# Patient Record
Sex: Male | Born: 1955 | Race: Black or African American | Hispanic: No | Marital: Single | Smoking: Never smoker
Health system: Southern US, Community
[De-identification: ages and names within clinical notes are randomized; demographics above are authoritative.]

## PROBLEM LIST (undated history)

## (undated) DIAGNOSIS — E785 Hyperlipidemia, unspecified: Secondary | ICD-10-CM

## (undated) DIAGNOSIS — F79 Unspecified intellectual disabilities: Secondary | ICD-10-CM

## (undated) DIAGNOSIS — Z794 Long term (current) use of insulin: Secondary | ICD-10-CM

## (undated) DIAGNOSIS — I10 Essential (primary) hypertension: Secondary | ICD-10-CM

## (undated) DIAGNOSIS — E1169 Type 2 diabetes mellitus with other specified complication: Secondary | ICD-10-CM

## (undated) DIAGNOSIS — E1165 Type 2 diabetes mellitus with hyperglycemia: Secondary | ICD-10-CM

## (undated) DIAGNOSIS — IMO0001 Reserved for inherently not codable concepts without codable children: Secondary | ICD-10-CM

## (undated) HISTORY — DX: Hyperlipidemia, unspecified: E78.5

## (undated) HISTORY — DX: Reserved for inherently not codable concepts without codable children: IMO0001

## (undated) HISTORY — PX: NO PAST SURGERIES: SHX2092

## (undated) HISTORY — DX: Long term (current) use of insulin: Z79.4

## (undated) HISTORY — DX: Type 2 diabetes mellitus with hyperglycemia: E11.65

## (undated) HISTORY — DX: Hyperlipidemia, unspecified: E11.69

## (undated) HISTORY — DX: Essential (primary) hypertension: I10

---

## 1998-02-22 ENCOUNTER — Other Ambulatory Visit: Admission: RE | Admit: 1998-02-22 | Discharge: 1998-02-22 | Payer: Self-pay | Admitting: Internal Medicine

## 1999-04-18 ENCOUNTER — Ambulatory Visit (HOSPITAL_COMMUNITY): Admission: RE | Admit: 1999-04-18 | Discharge: 1999-04-18 | Payer: Self-pay | Admitting: Internal Medicine

## 1999-04-18 ENCOUNTER — Encounter: Payer: Self-pay | Admitting: Internal Medicine

## 2003-10-27 ENCOUNTER — Emergency Department (HOSPITAL_COMMUNITY): Admission: EM | Admit: 2003-10-27 | Discharge: 2003-10-27 | Payer: Self-pay | Admitting: Emergency Medicine

## 2007-10-10 ENCOUNTER — Emergency Department (HOSPITAL_COMMUNITY): Admission: EM | Admit: 2007-10-10 | Discharge: 2007-10-10 | Payer: Self-pay | Admitting: Emergency Medicine

## 2008-10-11 ENCOUNTER — Emergency Department (HOSPITAL_COMMUNITY): Admission: EM | Admit: 2008-10-11 | Discharge: 2008-10-11 | Payer: Self-pay | Admitting: Emergency Medicine

## 2009-11-08 ENCOUNTER — Encounter: Admission: RE | Admit: 2009-11-08 | Discharge: 2009-11-08 | Payer: Self-pay | Admitting: Internal Medicine

## 2011-05-16 LAB — I-STAT 8, (EC8 V) (CONVERTED LAB)
Acid-Base Excess: 1
BUN: 17
Bicarbonate: 27.6 — ABNORMAL HIGH
Chloride: 102
Glucose, Bld: 165 — ABNORMAL HIGH
HCT: 48
Hemoglobin: 16.3
Operator id: 294521
Potassium: 4
Sodium: 135
TCO2: 29
pCO2, Ven: 47.5
pH, Ven: 7.371 — ABNORMAL HIGH

## 2011-05-16 LAB — URINALYSIS, ROUTINE W REFLEX MICROSCOPIC
Bilirubin Urine: NEGATIVE
Glucose, UA: NEGATIVE
Hgb urine dipstick: NEGATIVE
Specific Gravity, Urine: 1.003 — ABNORMAL LOW
pH: 7.5

## 2011-05-16 LAB — POCT I-STAT CREATININE
Creatinine, Ser: 1.1
Operator id: 294521

## 2016-07-06 ENCOUNTER — Emergency Department (HOSPITAL_COMMUNITY): Payer: Medicare Other

## 2016-07-06 ENCOUNTER — Encounter (HOSPITAL_COMMUNITY): Payer: Self-pay

## 2016-07-06 ENCOUNTER — Emergency Department (HOSPITAL_COMMUNITY)
Admission: EM | Admit: 2016-07-06 | Discharge: 2016-07-06 | Disposition: A | Discharge status: Home / Self Care | Payer: Medicare Other | Attending: Emergency Medicine | Admitting: Emergency Medicine

## 2016-07-06 DIAGNOSIS — K2900 Acute gastritis without bleeding: Secondary | ICD-10-CM | POA: Insufficient documentation

## 2016-07-06 DIAGNOSIS — Z7984 Long term (current) use of oral hypoglycemic drugs: Secondary | ICD-10-CM | POA: Diagnosis not present

## 2016-07-06 DIAGNOSIS — E119 Type 2 diabetes mellitus without complications: Secondary | ICD-10-CM | POA: Diagnosis not present

## 2016-07-06 DIAGNOSIS — Z7982 Long term (current) use of aspirin: Secondary | ICD-10-CM | POA: Diagnosis not present

## 2016-07-06 DIAGNOSIS — R1084 Generalized abdominal pain: Secondary | ICD-10-CM

## 2016-07-06 DIAGNOSIS — R1032 Left lower quadrant pain: Secondary | ICD-10-CM | POA: Diagnosis present

## 2016-07-06 DIAGNOSIS — Z79899 Other long term (current) drug therapy: Secondary | ICD-10-CM | POA: Diagnosis not present

## 2016-07-06 DIAGNOSIS — I10 Essential (primary) hypertension: Secondary | ICD-10-CM | POA: Diagnosis not present

## 2016-07-06 HISTORY — DX: Unspecified intellectual disabilities: F79

## 2016-07-06 LAB — CBC
HEMATOCRIT: 43.3 % (ref 39.0–52.0)
Hemoglobin: 13.6 g/dL (ref 13.0–17.0)
MCH: 21.7 pg — AB (ref 26.0–34.0)
MCHC: 31.4 g/dL (ref 30.0–36.0)
MCV: 69.2 fL — AB (ref 78.0–100.0)
Platelets: 273 10*3/uL (ref 150–400)
RBC: 6.26 MIL/uL — ABNORMAL HIGH (ref 4.22–5.81)
RDW: 14.5 % (ref 11.5–15.5)
WBC: 6.6 10*3/uL (ref 4.0–10.5)

## 2016-07-06 LAB — URINALYSIS, ROUTINE W REFLEX MICROSCOPIC
BILIRUBIN URINE: NEGATIVE
Glucose, UA: NEGATIVE mg/dL
HGB URINE DIPSTICK: NEGATIVE
Ketones, ur: NEGATIVE mg/dL
Leukocytes, UA: NEGATIVE
Nitrite: NEGATIVE
Protein, ur: NEGATIVE mg/dL
SPECIFIC GRAVITY, URINE: 1.025 (ref 1.005–1.030)
pH: 7.5 (ref 5.0–8.0)

## 2016-07-06 LAB — COMPREHENSIVE METABOLIC PANEL
ALT: 30 U/L (ref 17–63)
AST: 36 U/L (ref 15–41)
Albumin: 4.1 g/dL (ref 3.5–5.0)
Alkaline Phosphatase: 81 U/L (ref 38–126)
Anion gap: 11 (ref 5–15)
BUN: 19 mg/dL (ref 6–20)
CO2: 25 mmol/L (ref 22–32)
Calcium: 9.5 mg/dL (ref 8.9–10.3)
Chloride: 99 mmol/L — ABNORMAL LOW (ref 101–111)
Creatinine, Ser: 1.3 mg/dL — ABNORMAL HIGH (ref 0.61–1.24)
GFR calc Af Amer: >60 mL/min (ref >60)
GFR calc non Af Amer: 59 mL/min — ABNORMAL LOW (ref >60)
Glucose, Bld: 258 mg/dL — ABNORMAL HIGH (ref 65–99)
Potassium: 3.3 mmol/L — ABNORMAL LOW (ref 3.5–5.1)
Sodium: 135 mmol/L (ref 135–145)
Total Bilirubin: 0.5 mg/dL (ref 0.3–1.2)
Total Protein: 7 g/dL (ref 6.5–8.1)

## 2016-07-06 LAB — I-STAT TROPONIN, ED: Troponin i, poc: 0.01 ng/mL (ref 0.00–0.08)

## 2016-07-06 LAB — LIPASE, BLOOD: Lipase: 26 U/L (ref 11–51)

## 2016-07-06 MED ORDER — HYDROMORPHONE HCL 2 MG/ML IJ SOLN
1.0000 mg | Freq: Once | INTRAMUSCULAR | Status: AC
  Administered 2016-07-06: 1 mg via INTRAVENOUS
  Filled 2016-07-06: qty 1

## 2016-07-06 MED ORDER — IOPAMIDOL (ISOVUE-300) INJECTION 61%
INTRAVENOUS | Status: AC
  Administered 2016-07-06: 100 mL
  Filled 2016-07-06: qty 100

## 2016-07-06 MED ORDER — ONDANSETRON 4 MG PO TBDP: 4.0000 mg | ORAL_TABLET | Freq: Three times a day (TID) | ORAL | 1 refills | Status: DC | PRN

## 2016-07-06 MED ORDER — ONDANSETRON HCL 4 MG/2ML IJ SOLN
4.0000 mg | Freq: Once | INTRAMUSCULAR | Status: AC
  Administered 2016-07-06: 4 mg via INTRAVENOUS
  Filled 2016-07-06: qty 2

## 2016-07-06 MED ORDER — SODIUM CHLORIDE 0.9 % IV SOLN: INTRAVENOUS | Status: DC

## 2016-07-06 MED ORDER — HYDROCODONE-ACETAMINOPHEN 5-325 MG PO TABS: 1.0000 | ORAL_TABLET | Freq: Four times a day (QID) | ORAL | 0 refills | Status: DC | PRN

## 2016-07-06 MED ORDER — SODIUM CHLORIDE 0.9 % IV BOLUS (SEPSIS)
500.0000 mL | Freq: Once | INTRAVENOUS | Status: AC
  Administered 2016-07-06: 500 mL via INTRAVENOUS

## 2016-07-06 NOTE — ED Provider Notes (Signed)
[facility]-EMERGENCY DEPT Provider Note   CSN: [id number] Arrival date & time: 07/06/16  1306     History   Chief Complaint Chief Complaint  Patient presents with  . Abdominal Pain  . Chest Pain    HPI Dalton Williams is a 60 y.o. male.  Patient with complaint of nausea and dry heaves and abdominal pain all over but mostly left lower quadrant. Also some epigastric abdominal pain with radiation into the chest. Patient was seen by his primary care doctor on Thursday did have his flu shot at that time. Also started on MiraLAX for some concerns for constipation. Patient then started with epigastric abdominal pain left lower quadrant abdominal pain dry heaves. Symptoms onset was Friday.      Past Medical History:  Diagnosis Date  . Diabetes mellitus without complication (HCC)   . Hypertension   . Mental retardation     There are no active problems to display for this patient.   No past surgical history on file.     Home Medications    Prior to Admission medications   Medication Sig Start Date End Date Taking? Authorizing Provider  amLODipine (NORVASC) 5 MG tablet Take 5 mg by mouth daily. 06/03/16  Yes Historical Provider, MD  aspirin 81 MG chewable tablet Chew 81 mg by mouth daily.   Yes Historical Provider, MD  hydrochlorothiazide (HYDRODIURIL) 25 MG tablet Take 25 mg by mouth daily. 05/30/16  Yes Historical Provider, MD  irbesartan (AVAPRO) 300 MG tablet Take 300 mg by mouth daily. 06/03/16  Yes Historical Provider, MD  metFORMIN (GLUCOPHAGE) 1000 MG tablet Take 1,000 mg by mouth 2 (two) times daily. 05/11/16  Yes Historical Provider, MD  Multiple Vitamin (MULTIVITAMIN) tablet Take 1 tablet by mouth daily.   Yes Historical Provider, MD  oxybutynin (DITROPAN) 5 MG tablet Take 5 mg by mouth 2 (two) times daily. 04/24/16  Yes Historical Provider, MD  HYDROcodone-acetaminophen (NORCO/VICODIN) 5-325 MG tablet Take 1-2 tablets by mouth every 6 (six) hours as needed for moderate  pain. 07/06/16   Vanetta Mulders, MD  ondansetron (ZOFRAN ODT) 4 MG disintegrating tablet Take 1 tablet (4 mg total) by mouth every 8 (eight) hours as needed for nausea or vomiting. 07/06/16   Vanetta Mulders, MD    Family History No family history on file.  Social History Social History  Substance Use Topics  . Smoking status: Never Smoker  . Smokeless tobacco: Never Used  . Alcohol use No     Allergies   Penicillins   Review of Systems Review of Systems  Constitutional: Negative for fever.  HENT: Negative for congestion.   Eyes: Negative for redness.  Respiratory: Negative for shortness of breath.   Cardiovascular: Positive for chest pain. Negative for leg swelling.  Gastrointestinal: Positive for abdominal pain, nausea and vomiting. Negative for diarrhea.  Genitourinary: Negative for dysuria.  Musculoskeletal: Negative for back pain and neck pain.  Skin: Negative for rash.  Neurological: Negative for headaches.  Hematological: Does not bruise/bleed easily.  Psychiatric/Behavioral: Negative for confusion.     Physical Exam Updated Vital Signs BP 124/74   Pulse 83   Temp 99.1 F (37.3 C) (Oral)   Resp 13   Ht 5\' 2"  (1.575 m)   Wt 93 kg   SpO2 96%   BMI 37.49 kg/m   Physical Exam  Constitutional: He is oriented to person, place, and time. He appears well-developed and well-nourished. No distress.  HENT:  Head: Normocephalic and atraumatic.  Mouth/Throat: Oropharynx is clear  and moist.  Eyes: EOM are normal. Pupils are equal, round, and reactive to light.  Neck: Normal range of motion. Neck supple.  Cardiovascular: Normal rate, regular rhythm and normal heart sounds.   Pulmonary/Chest: Effort normal and breath sounds normal. No respiratory distress.  Abdominal: Soft. Bowel sounds are normal. There is tenderness.  Mild tenderness left lower quadrant. No guarding.  Musculoskeletal: He exhibits no edema.  Neurological: He is alert and oriented to person,  place, and time. No cranial nerve deficit or sensory deficit. He exhibits normal muscle tone. Coordination normal.  Skin: Skin is warm.  Nursing note and vitals reviewed.    ED Treatments / Results  Labs (all labs ordered are listed, but only abnormal results are displayed) Labs Reviewed  CBC - Abnormal; Notable for the following:       Result Value   RBC 6.26 (*)    MCV 69.2 (*)    MCH 21.7 (*)    All other components within normal limits  COMPREHENSIVE METABOLIC PANEL - Abnormal; Notable for the following:    Potassium 3.3 (*)    Chloride 99 (*)    Glucose, Bld 258 (*)    Creatinine, Ser 1.30 (*)    GFR calc non Af Amer 59 (*)    All other components within normal limits  LIPASE, BLOOD  URINALYSIS, ROUTINE W REFLEX MICROSCOPIC (NOT [facility])  I-STAT TROPOININ, ED    EKG  EKG Interpretation  Date/Time:  Sunday July 06 2016 13:12:41 EST Ventricular Rate:  106 PR Interval:    QRS Duration: 96 QT Interval:  336 QTC Calculation: 447 R Axis:   -30 Text Interpretation:  Sinus tachycardia Multiple premature complexes, vent & supraven Abnormal R-wave progression, early transition LVH with secondary repolarization abnormality No significant change since last tracing Confirmed by Theadora Noyes  MD, [facility] [phone]([id number]) on 07/06/2016 2:20:17 PM       Radiology Ct Abdomen Pelvis W Contrast  Result Date: 07/06/2016 CLINICAL DATA:  Lower abdominal pain with nausea. EXAM: CT ABDOMEN AND PELVIS WITH CONTRAST TECHNIQUE: Multidetector CT imaging of the abdomen and pelvis was performed using the standard protocol following bolus administration of intravenous contrast. CONTRAST:  100mL ISOVUE-300 IOPAMIDOL (ISOVUE-300) INJECTION 61% COMPARISON:  November 08, 2009 FINDINGS: Lower chest: No acute abnormality. Hepatobiliary: No focal liver abnormality is seen. No gallstones, gallbladder wall thickening, or biliary dilatation. Pancreas: Unremarkable. No pancreatic ductal dilatation or surrounding  inflammatory changes. Spleen: Normal in size without focal abnormality. Adrenals/Urinary Tract: The adrenal glands are within normal limits. Bilateral renal cysts are identified. The kidneys are otherwise normal. Stomach/Bowel: The stomach and small bowel are normal. The colon and appendix are unremarkable. Vascular/Lymphatic: The abdominal aorta is non aneurysmal with mild atherosclerotic change. No adenopathy. Reproductive: Prostate is unremarkable. Other: No other abnormalities. Musculoskeletal: Mild retrolisthesis of L5 versus S1 is stable. Degenerative changes are seen most marked at L5-S1. IMPRESSION: 1. No cause for acute symptoms identified. 2. Mild atherosclerotic change in the abdominal aorta. Electronically Signed   By: Gerome Samavid  Williams III M.D   On: 07/06/2016 16:00   Dg Chest Portable 1 View  Result Date: 07/06/2016 CLINICAL DATA:  Epigastric pain.  Chest pain and diaphoresis. EXAM: PORTABLE CHEST 1 VIEW COMPARISON:  10/11/2008 FINDINGS: Cardiac enlargement. Mediastinal contours are within normal limits. Both lungs are clear. The visualized skeletal structures are unremarkable. IMPRESSION: 1. Cardiac enlargement. 2. No acute findings. Electronically Signed   By: Signa Kellaylor  Stroud M.D.   On: 07/06/2016 13:39    Procedures  Procedures (including critical care time)  Medications Ordered in ED Medications  0.9 %  sodium chloride infusion ( Intravenous Hold 07/06/16 1414)  sodium chloride 0.9 % bolus 500 mL (0 mLs Intravenous Stopped 07/06/16 1445)  ondansetron (ZOFRAN) injection 4 mg (4 mg Intravenous Given 07/06/16 1414)  HYDROmorphone (DILAUDID) injection 1 mg (1 mg Intravenous Given 07/06/16 1416)  iopamidol (ISOVUE-300) 61 % injection (100 mLs  Contrast Given 07/06/16 1534)     Initial Impression / Assessment and Plan / ED Course  I have reviewed the triage vital signs and the nursing notes.  Pertinent labs & imaging results that were available during my care of the patient were  reviewed by me and considered in my medical decision making (see chart for details).  Clinical Course     Patient nontoxic no acute distress. Patient had normal physical examination done by his primary care doctor on Thursday. There was some concern perhaps for constipation at that time so he was started on MiraLAX. Friday patient with onset of nausea and dry heaves and abdominal pain. Pain predominantly left lower quadrant but hurts all over.  Workup here without any significant lab abnormalities no sniffing vital sign abnormalities and CT scan the abdomen and pelvis was negative for any acute findings. In addition patient troponin was normal. Did have some burning in the epigastric area into the chest. And patient's EKG had no acute changes. Chest x-ray was negative.  Suspect a viral illness most likely a viral gastritis. We'll treat symptomatically and close follow-up with primary care doctor.  Final Clinical Impressions(s) / ED Diagnoses   Final diagnoses:  Generalized abdominal pain  Acute gastritis without hemorrhage, unspecified gastritis type    New Prescriptions New Prescriptions   HYDROCODONE-ACETAMINOPHEN (NORCO/VICODIN) 5-325 MG TABLET    Take 1-2 tablets by mouth every 6 (six) hours as needed for moderate pain.   ONDANSETRON (ZOFRAN ODT) 4 MG DISINTEGRATING TABLET    Take 1 tablet (4 mg total) by mouth every 8 (eight) hours as needed for nausea or vomiting.     Vanetta MuldersScott Jamorris Ndiaye, MD 07/06/16 [phone]

## 2016-07-06 NOTE — ED Notes (Signed)
Pt transported to CT ?

## 2016-07-06 NOTE — Discharge Instructions (Signed)
Return for any new or worse symptoms. Today's workup including CT of the abdomen without any acute findings. Suspect a viral stomach illness. Take Zofran as needed for the nausea and dry heaves. Take the hydrocodone as needed for abdominal pain. Follow-up with your doctor in the next few days if not better.

## 2016-07-06 NOTE — ED Triage Notes (Addendum)
Per EMS - pt from home. Pt c/o epigastric pain, dry heaves, diaphoresis x few days. Pt hx MR, diabetes, htn, abd issues. Given 4mg  zofran.

## 2017-02-24 DIAGNOSIS — F819 Developmental disorder of scholastic skills, unspecified: Secondary | ICD-10-CM | POA: Insufficient documentation

## 2017-04-09 DIAGNOSIS — E1169 Type 2 diabetes mellitus with other specified complication: Secondary | ICD-10-CM | POA: Insufficient documentation

## 2018-06-22 IMAGING — CT CT ABD-PELV W/ CM
2 of 5 series · 11 of 46 positions shown, 12 images · IV contrast (Iodine)
Comparison: November 08, 2009

CLINICAL DATA: Lower abdominal pain with nausea.

EXAM:
CT ABDOMEN AND PELVIS WITH CONTRAST
TECHNIQUE: Multidetector CT imaging of the abdomen and pelvis was performed
using the standard protocol following bolus administration of
intravenous contrast.
CONTRAST:  100mL 1VCE4B-UEE IOPAMIDOL (1VCE4B-UEE) INJECTION 61%

[Series 201: routine, idose (2) · axial · 0.86mm/px · z∈[+120,+500]mm · 8 of 93 slices shown, 9 images]
[im 11/93  soft-tissue]
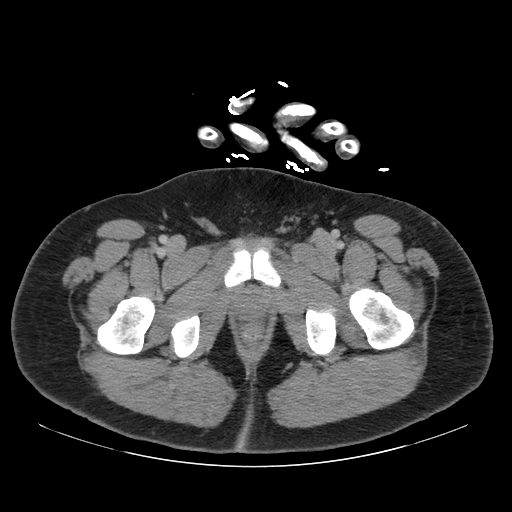
[im 11/93  bone]
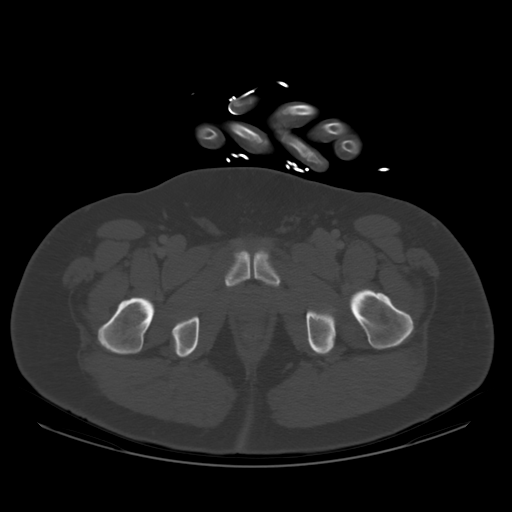
[im 22/93  soft-tissue]
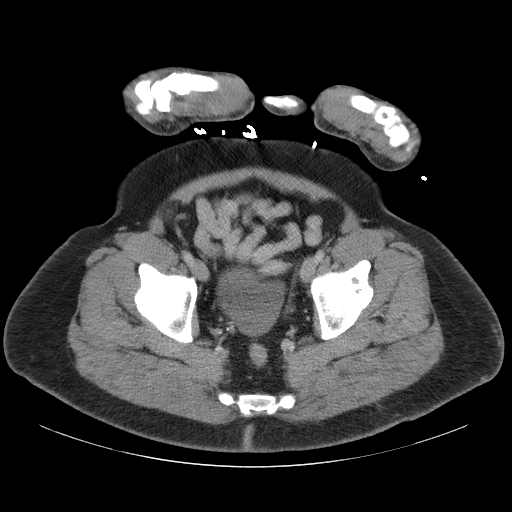
[im 33/93  soft-tissue]
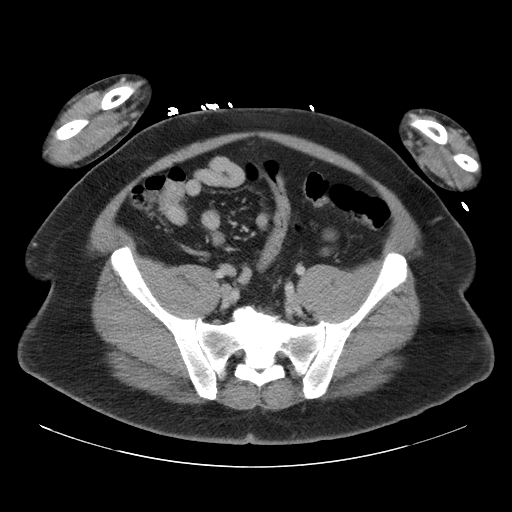
[im 44/93  soft-tissue]
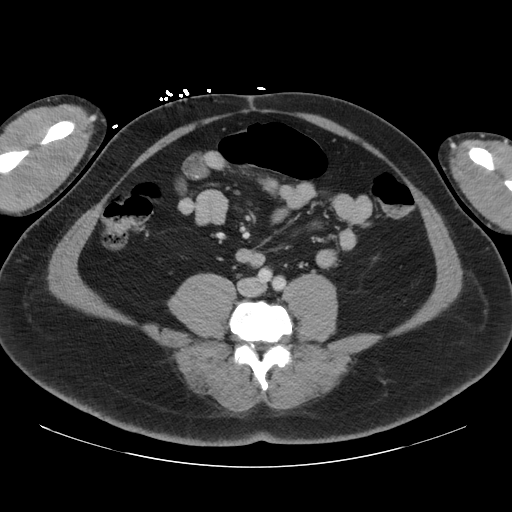
[im 55/93  soft-tissue]
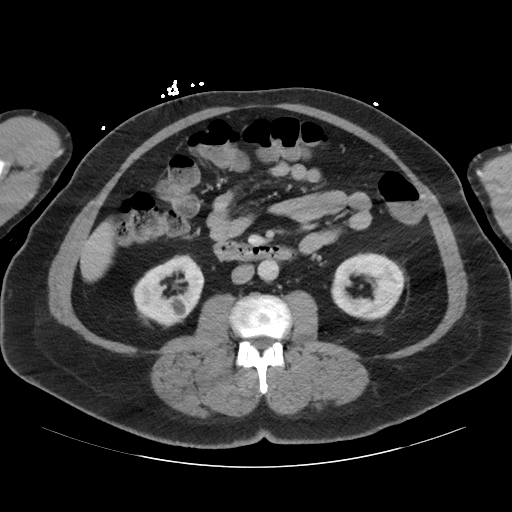
[im 65/93  soft-tissue]
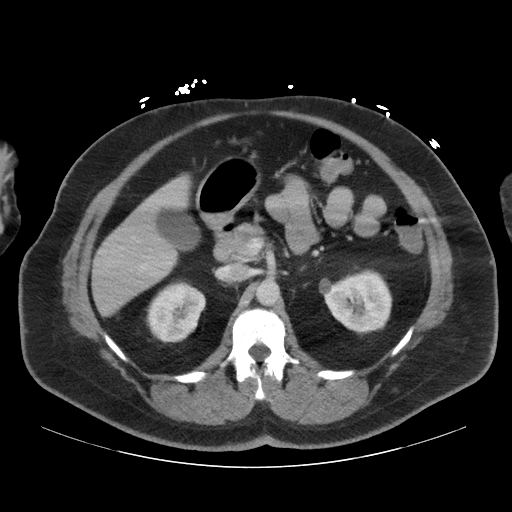
[im 76/93  soft-tissue]
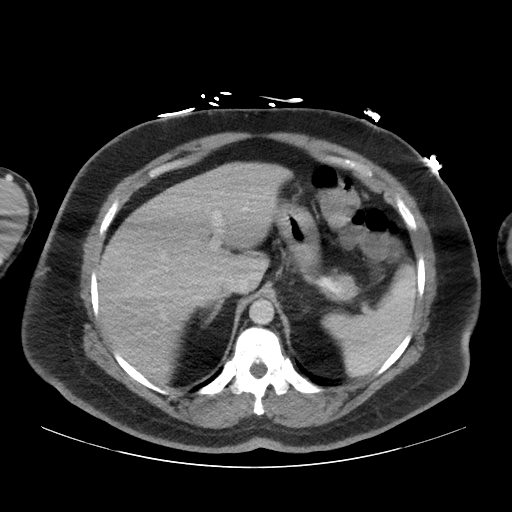
[im 87/93  soft-tissue]
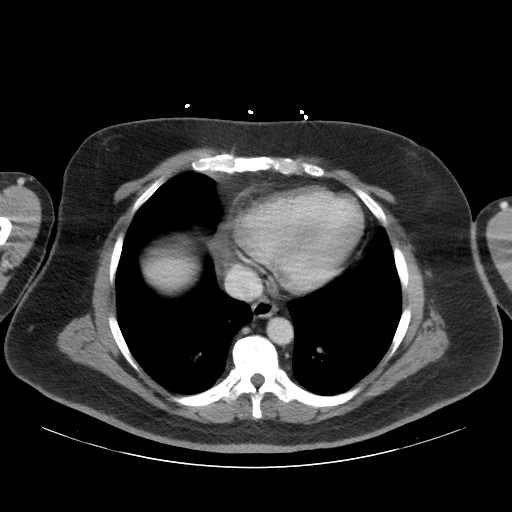

[Series 203: coronals, idose (2) · coronal · 0.45mm/px · 3 of 142 slices shown]
[im 48/142  soft-tissue]
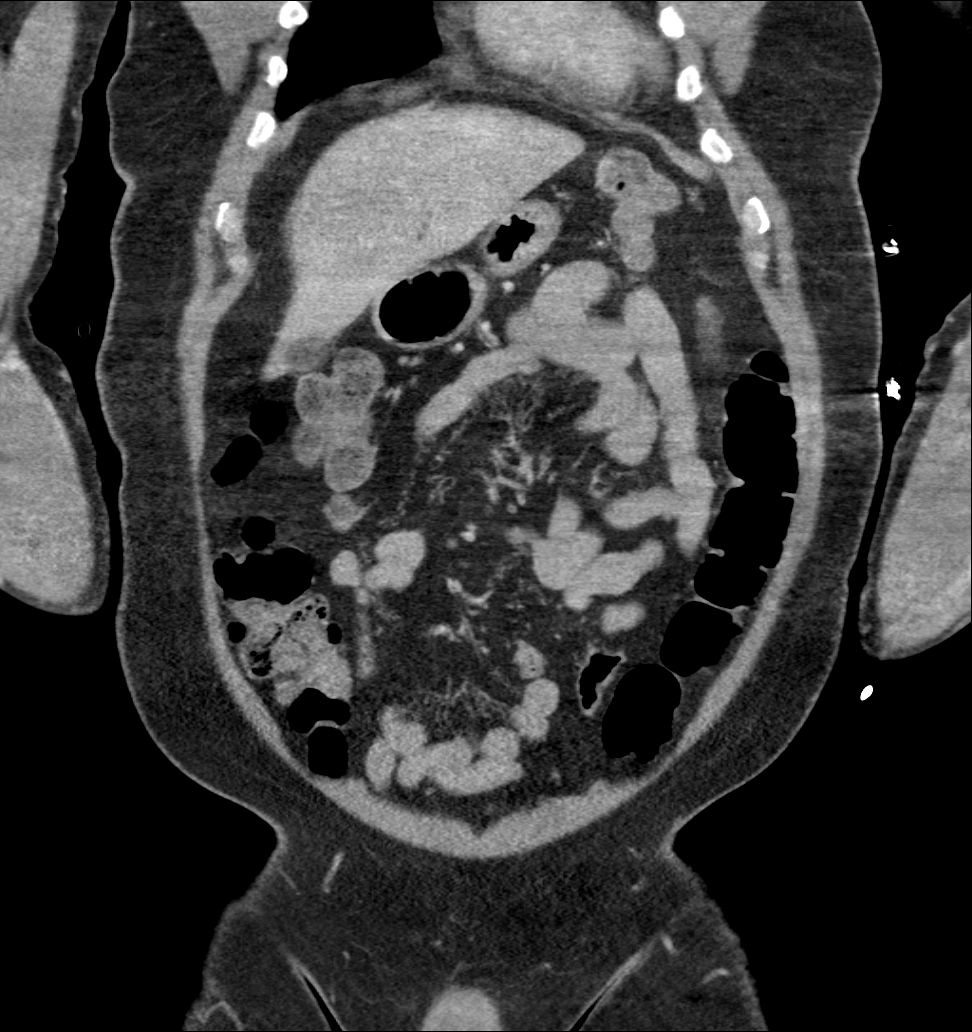
[im 63/142  soft-tissue]
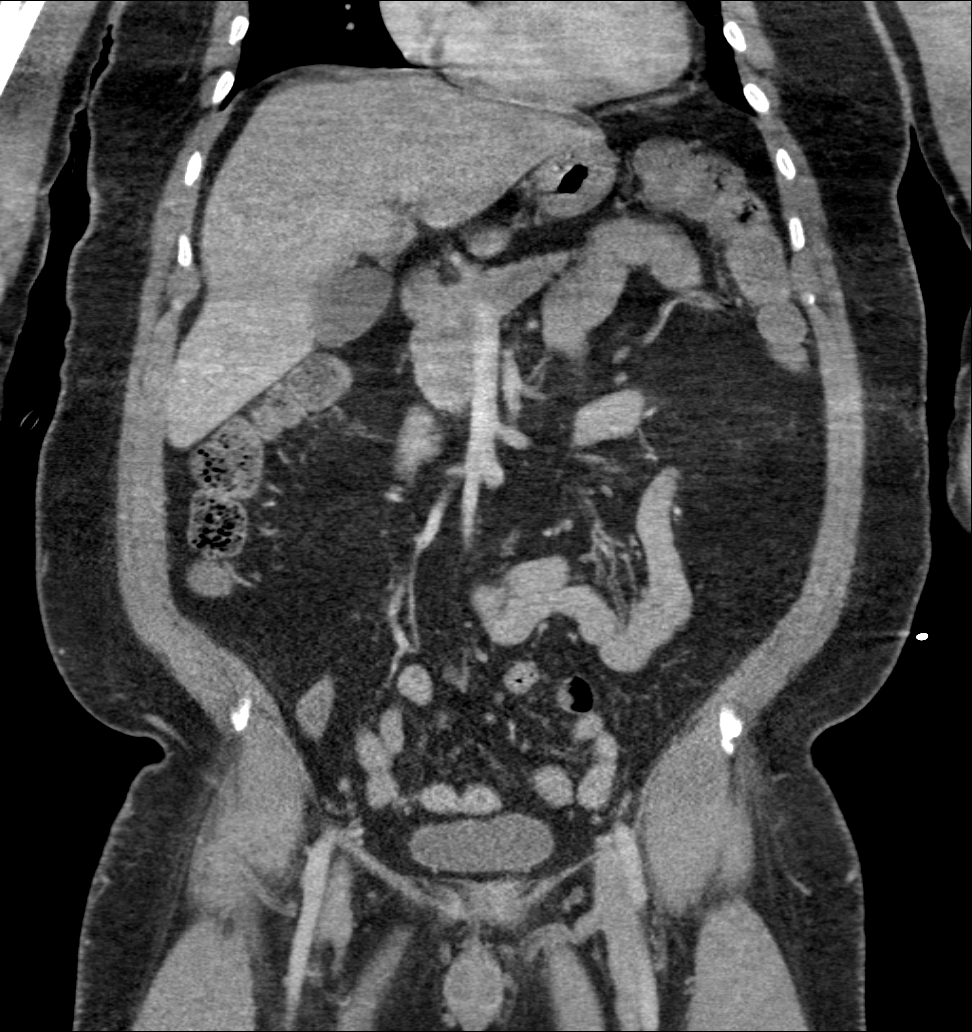
[im 79/142  soft-tissue]
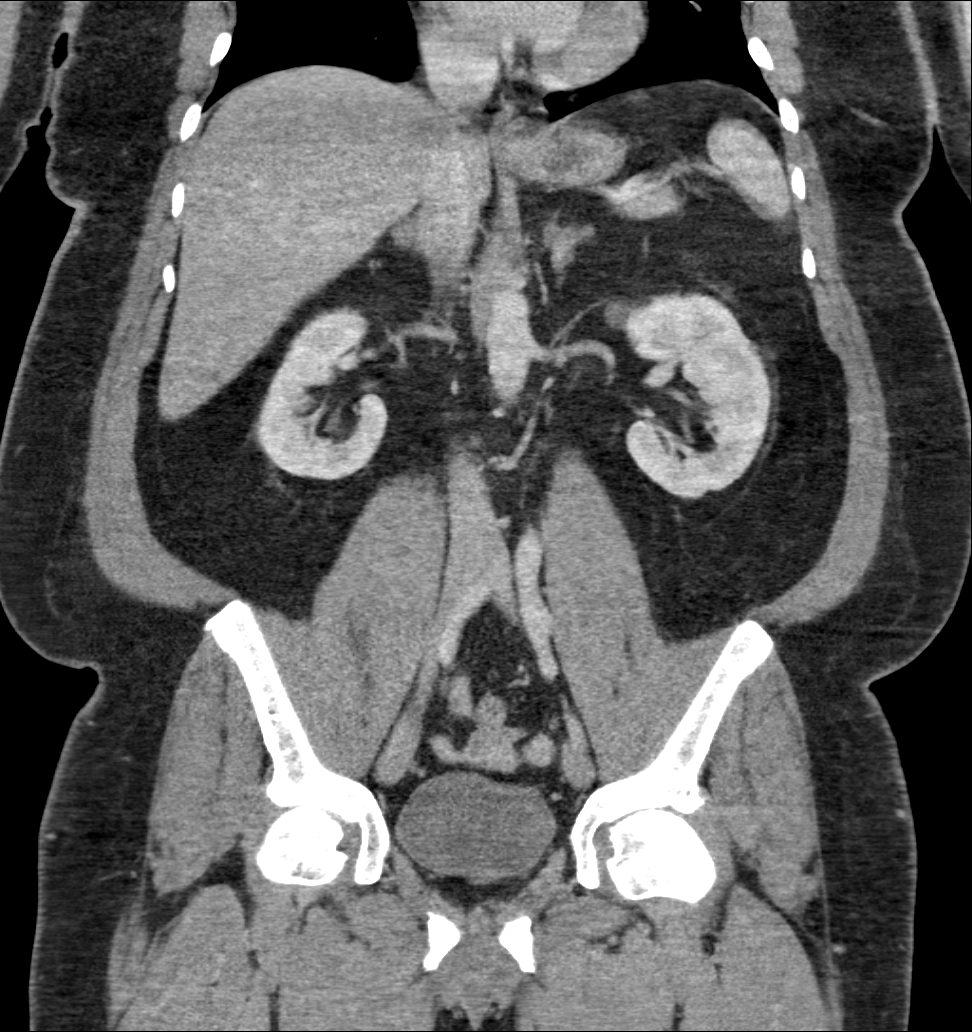

[11 of 46 positions shown; findings below may reference images not displayed]

FINDINGS: Lower chest: No acute abnormality.

Hepatobiliary: No focal liver abnormality is seen. No gallstones,
gallbladder wall thickening, or biliary dilatation.

Pancreas: Unremarkable. No pancreatic ductal dilatation or
surrounding inflammatory changes.

Spleen: Normal in size without focal abnormality.

Adrenals/Urinary Tract: The adrenal glands are within normal limits.
Bilateral renal cysts are identified. The kidneys are otherwise
normal.

Stomach/Bowel: The stomach and small bowel are normal. The colon and
appendix are unremarkable.

Vascular/Lymphatic: The abdominal aorta is non aneurysmal with mild
atherosclerotic change. No adenopathy.

Reproductive: Prostate is unremarkable.

Other: No other abnormalities.

Musculoskeletal: Mild retrolisthesis of L5 versus S1 is stable.
Degenerative changes are seen most marked at L5-S1.
IMPRESSION: 1. No cause for acute symptoms identified.
2. Mild atherosclerotic change in the abdominal aorta.

## 2018-09-30 ENCOUNTER — Ambulatory Visit (INDEPENDENT_AMBULATORY_CARE_PROVIDER_SITE_OTHER): Payer: Medicare Other | Admitting: Family Medicine

## 2018-09-30 ENCOUNTER — Encounter: Payer: Self-pay | Admitting: Family Medicine

## 2018-09-30 VITALS — BP 183/92 | HR 92 | Resp 17 | Ht 63.0 in | Wt 213.8 lb

## 2018-09-30 DIAGNOSIS — R0602 Shortness of breath: Secondary | ICD-10-CM

## 2018-09-30 DIAGNOSIS — I10 Essential (primary) hypertension: Secondary | ICD-10-CM

## 2018-09-30 DIAGNOSIS — E1165 Type 2 diabetes mellitus with hyperglycemia: Secondary | ICD-10-CM

## 2018-09-30 DIAGNOSIS — Z794 Long term (current) use of insulin: Secondary | ICD-10-CM

## 2018-09-30 DIAGNOSIS — I517 Cardiomegaly: Secondary | ICD-10-CM | POA: Diagnosis not present

## 2018-09-30 DIAGNOSIS — E1169 Type 2 diabetes mellitus with other specified complication: Secondary | ICD-10-CM

## 2018-09-30 DIAGNOSIS — Z7689 Persons encountering health services in other specified circumstances: Secondary | ICD-10-CM

## 2018-09-30 LAB — GLUCOSE, POCT (MANUAL RESULT ENTRY): POC GLUCOSE: 159 mg/dL — AB (ref 70–99)

## 2018-09-30 MED ORDER — CARVEDILOL 6.25 MG PO TABS: 6.2500 mg | ORAL_TABLET | Freq: Two times a day (BID) | ORAL | 3 refills | Status: DC

## 2018-09-30 NOTE — Progress Notes (Signed)
Dalton Williams, is a 63 y.o. male  ZOX:[id number]:[id number]  [id number]:[id number]  DOB - [birth year]  CC:  Chief Complaint  Patient presents with  . Establish Care  . Diabetes  . Hypertension  . Hyperlipidemia       HPI: Dalton Williams is a 63 y.o. male is here today to establish care.   Dalton Williams has Diabetes mellitus (HCC); Left ventricular hypertrophy; SOB (shortness of breath); and Essential hypertension on their problem list.  Patient presents with his mother. Patient has a cognitive impairment and lives with mother. He is verbal and well aware of his health conditions however, requires assistant with medications. Mother and patient are providing the history.  Diabetes  Monitors blood sugar at home.  Reports fasting readings range anywhere from 118-130 fasting. Blood glucose on arrival is 159 non-fasting. Reports insulin dependent diabetic since adolescents.  Last A1C 7.8 in December. Mom endorses efforts to provide healthy low carbohydrate meals. Patient denies hypoglycemia.  Adheres to current medication regimen. Reports associated foot pain, which persists in spite of regularly taking Gabapentin. Denies any associated urinary frequency, visual disturbances, episodic diaphoresis or hot flashes.   Hypertension  Dalton Williams reports no home monitoring of blood pressure. Reports adherence to blood pressure medications. Reports efforts to adhere to low sodium diet. He is a nonsmoker. Denies any episodes of dizziness,headaches, or chest pain. Mother reports a history of LVH. patietn endorses chronic shortness of breath with exertion and while at rest. Denies cough or edema. No formal follow-up by cardiology recently but mother reports patient was to see a cardiologist at some point. No known history of echocardiogram. LVH is noted in review of EMR.   Current medications: Current Outpatient Medications:  .  amLODipine (NORVASC) 10 MG tablet, Take 1 tablet by mouth daily., Disp: , Rfl:  .  aspirin  81 MG chewable tablet, Chew 81 mg by mouth daily., Disp: , Rfl:  .  insulin NPH Human (HUMULIN N,NOVOLIN N) 100 UNIT/ML injection, 40 units qam, 35 units qpm, Disp: , Rfl:  .  Insulin Syringe-Needle U-100 (BD INSULIN SYRINGE U/F) 31G X 5/16" 0.5 ML MISC, USE AS DIRECTED FOR INSULIN INJECTIONS TWICE DAILY, Disp: , Rfl:  .  irbesartan (AVAPRO) 300 MG tablet, Take 300 mg by mouth daily., Disp: , Rfl: 6 .  metFORMIN (GLUCOPHAGE) 500 MG tablet, TAKE 2 TABLETS(1000 MG) BY MOUTH TWICE DAILY WITH MEALS, Disp: , Rfl:  .  Multiple Vitamin (MULTIVITAMIN) tablet, Take 1 tablet by mouth daily., Disp: , Rfl:  .  oxybutynin (DITROPAN) 5 MG tablet, Take 5 mg by mouth 2 (two) times daily., Disp: , Rfl: 3 .  pravastatin (PRAVACHOL) 80 MG tablet, Take 1 tablet by mouth daily., Disp: , Rfl:  .  hydrochlorothiazide (HYDRODIURIL) 25 MG tablet, Take 25 mg by mouth daily., Disp: , Rfl: 6   Pertinent family medical history: family history includes Diabetes in his mother and sister; Kidney failure in his father; Stroke in his brother.   Allergies  Allergen Reactions  . Penicillins Rash    Social History   Socioeconomic History  . Marital status: Single    Spouse name: Not on file  . Number of children: Not on file  . Years of education: Not on file  . Highest education level: Not on file  Occupational History  . Not on file  Social Needs  . Financial resource strain: Not on file  . Food insecurity:    Worry: Not on file    Inability: Not on file  .  Transportation needs:    Medical: Not on file    Non-medical: Not on file  Tobacco Use  . Smoking status: Never Smoker  . Smokeless tobacco: Never Used  Substance and Sexual Activity  . Alcohol use: No  . Drug use: No  . Sexual activity: Not on file  Lifestyle  . Physical activity:    Days per week: Not on file    Minutes per session: Not on file  . Stress: Not on file  Relationships  . Social connections:    Talks on phone: Not on file    Gets  together: Not on file    Attends religious service: Not on file    Active member of club or organization: Not on file    Attends meetings of clubs or organizations: Not on file    Relationship status: Not on file  . Intimate partner violence:    Fear of current or ex partner: Not on file    Emotionally abused: Not on file    Physically abused: Not on file    Forced sexual activity: Not on file  Other Topics Concern  . Not on file  Social History Narrative  . Not on file    Review of Systems: Constitutional: Negative for fever, chills, diaphoresis, activity change, appetite change and fatigue. HENT: Negative for ear pain, nosebleeds, congestion, facial swelling, rhinorrhea, neck pain, neck stiffness and ear discharge.  Eyes: Negative for pain, discharge, redness, itching and visual disturbance. Respiratory: Negative for cough, choking, chest tightness, shortness of breath, wheezing and stridor.  Cardiovascular: Negative for chest pain, palpitations and leg swelling. Gastrointestinal: Negative for abdominal distention. Genitourinary: Negative for dysuria, urgency, frequency, hematuria, flank pain, decreased urine volume, difficulty urinating. Musculoskeletal: Negative for back pain, joint swelling, arthralgia and gait problem. Neurological: Negative for dizziness, tremors, seizures, syncope, facial asymmetry, speech difficulty, weakness, light-headedness, numbness and headaches.  Hematological: Negative for adenopathy. Does not bruise/bleed easily. Psychiatric/Behavioral: Negative for hallucinations, behavioral problems, confusion, dysphoric mood, decreased concentration and agitation.    Objective:   Vitals:   09/30/18 1003  BP: (!) 183/92  Pulse: 92  Resp: 17  SpO2: 95%    BP Readings from Last 3 Encounters:  09/30/18 (!) 183/92  07/06/16 124/74    Filed Weights   09/30/18 1003  Weight: 213 lb 12.8 oz (97 kg)      Physical Exam: Constitutional: Patient appears  well-developed and well-nourished. No distress. HENT: Normocephalic, atraumatic, External right and left ear normal.  Eyes: Conjunctivae and EOM are normal. PERRLA, no scleral icterus. Neck: Normal ROM. Neck supple. No JVD. No tracheal deviation. No thyromegaly. CVS: RRR, S1/S2 +, no murmurs, no gallops, no carotid bruit.  Pulmonary: Effort and breath sounds normal, no stridor, rhonchi, wheezes, rales.  Abdominal: Soft. BS +, no distension, tenderness, rebound or guarding.  Musculoskeletal: Normal range of motion. No edema and no tenderness.  Neuro: Alert. Normal muscle tone coordination. Normal gait. BUE and BLE strength 5/5. Bilateral hand grips symmetrical. Skin: Skin is warm and dry. No rash noted. Not diaphoretic. No erythema. No pallor. Psychiatric: Normal mood and affect. Behavior, judgment, thought content normal.  Lab Results (prior encounters)  Lab Results  Component Value Date   WBC 6.6 07/06/2016   HGB 13.6 07/06/2016   HCT 43.3 07/06/2016   MCV 69.2 (L) 07/06/2016   PLT 273 07/06/2016   Lab Results  Component Value Date   CREATININE 1.30 (H) 07/06/2016   BUN 19 07/06/2016   NA 135 07/06/2016  K 3.3 (L) 07/06/2016   CL 99 (L) 07/06/2016   CO2 25 07/06/2016       Assessment and plan:  1. Encounter to establish care  2. Type 2 diabetes mellitus with other specified complication, with long-term current use of insulin (HCC) Most recent A1c 7.8 will repeat at subsequent follow-up.  No changes to current medication regimen as blood sugars have remained less than 150 at home. - Glucose (CBG)-159  3. Essential hypertension We have discussed target BP range and blood pressure goal. I have advised patient to check BP regularly and to call us back or report to clinic if the numbers are consistently higher than 140/90. We discussed the importance of compliance with medical therapy and DASH diet recommended, consequences of uncontrolled hypertension discussed.   4. Left  ventricular hypertrophy Referring patient to cardiology for further evaluation of shortness of breath.  - Ambulatory referral to Cardiology  5. SOB (shortness of breath), - Ambulatory referral to Cardiology   Return in about 6 weeks (around 11/11/2018) for Hypertension BP evaluation .   The patient was given clear instructions to go to ER or return to medical center if symptoms don't improve, worsen or new problems develop. The patient verbalized understanding. The patient was advised  to call and obtain lab results if they haven't heard anything from out office within 7-10 business days.  Joaquin Courts, FNP Primary [facility] at [facility] [address], [place] [address] [phone]: [phone]    This note has been created with Dragon speech recognition software and Paediatric nurse. Any transcriptional errors are unintentional.

## 2018-09-30 NOTE — Patient Instructions (Addendum)
[facility]    Edward DenmarkEngland was seen by Joaquin CourtsKimberly Harris, FNP today.   Caylan Pollan's primary care provider is Bing NeighborsHarris, Kimberly S, FNP.   For the best care possible, you should try to see Joaquin CourtsKimberly Harris, FNP-C whenever you come to the clinic.   We look forward to seeing you again soon!  If you have any questions about your visit today, please call us at [phone] or feel free to reach your primary care provider via MyChart.         Hypertension Hypertension, commonly called high blood pressure, is when the force of blood pumping through the arteries is too strong. The arteries are the blood vessels that carry blood from the heart throughout the body. Hypertension forces the heart to work harder to pump blood and may cause arteries to become narrow or stiff. Having untreated or uncontrolled hypertension can cause heart attacks, strokes, kidney disease, and other problems. A blood pressure reading consists of a higher number over a lower number. Ideally, your blood pressure should be below 120/80. The first ("top") number is called the systolic pressure. It is a measure of the pressure in your arteries as your heart beats. The second ("bottom") number is called the diastolic pressure. It is a measure of the pressure in your arteries as the heart relaxes. What are the causes? The cause of this condition is not known. What increases the risk? Some risk factors for high blood pressure are under your control. Others are not. Factors you can change  Smoking.  Having type 2 diabetes mellitus, high cholesterol, or both.  Not getting enough exercise or physical activity.  Being overweight.  Having too much fat, sugar, calories, or salt (sodium) in your diet.  Drinking too much alcohol. Factors that are difficult or impossible to change  Having chronic kidney disease.  Having a family history of high blood  pressure.  Age. Risk increases with age.  Race. You may be at higher risk if you are African-American.  Gender. Men are at higher risk than women before age 62. After age 62, women are at higher risk than men.  Having obstructive sleep apnea.  Stress. What are the signs or symptoms? Extremely high blood pressure (hypertensive crisis) may cause:  Headache.  Anxiety.  Shortness of breath.  Nosebleed.  Nausea and vomiting.  Severe chest pain.  Jerky movements you cannot control (seizures). How is this diagnosed? This condition is diagnosed by measuring your blood pressure while you are seated, with your arm resting on a surface. The cuff of the blood pressure monitor will be placed directly against the skin of your upper arm at the level of your heart. It should be measured at least twice using the same arm. Certain conditions can cause a difference in blood pressure between your right and left arms. Certain factors can cause blood pressure readings to be lower or higher than normal (elevated) for a short period of time:  When your blood pressure is higher when you are in a health care provider's office than when you are at home, this is called white coat hypertension. Most people with this condition do not need medicines.  When your blood pressure is higher at home than when you are in a health care provider's office, this is called masked hypertension. Most people with this condition may need medicines to control blood pressure. If you have a high blood pressure reading during  one visit or you have normal blood pressure with other risk factors:  You may be asked to return on a different day to have your blood pressure checked again.  You may be asked to monitor your blood pressure at home for 1 week or longer. If you are diagnosed with hypertension, you may have other blood or imaging tests to help your health care provider understand your overall risk for other conditions. How  is this treated? This condition is treated by making healthy lifestyle changes, such as eating healthy foods, exercising more, and reducing your alcohol intake. Your health care provider may prescribe medicine if lifestyle changes are not enough to get your blood pressure under control, and if:  Your systolic blood pressure is above 130.  Your diastolic blood pressure is above 80. Your personal target blood pressure may vary depending on your medical conditions, your age, and other factors. Follow these instructions at home: Eating and drinking   Eat a diet that is high in fiber and potassium, and low in sodium, added sugar, and fat. An example eating plan is called the DASH (Dietary Approaches to Stop Hypertension) diet. To eat this way: ? Eat plenty of fresh fruits and vegetables. Try to fill half of your plate at each meal with fruits and vegetables. ? Eat whole grains, such as whole wheat pasta, brown rice, or whole grain bread. Fill about one quarter of your plate with whole grains. ? Eat or drink low-fat dairy products, such as skim milk or low-fat yogurt. ? Avoid fatty cuts of meat, processed or cured meats, and poultry with skin. Fill about one quarter of your plate with lean proteins, such as fish, chicken without skin, beans, eggs, and tofu. ? Avoid premade and processed foods. These tend to be higher in sodium, added sugar, and fat.  Reduce your daily sodium intake. Most people with hypertension should eat less than 1,500 mg of sodium a day.  Limit alcohol intake to no more than 1 drink a day for nonpregnant women and 2 drinks a day for men. One drink equals 12 oz of beer, 5 oz of wine, or 1 oz of hard liquor. Lifestyle   Work with your health care provider to maintain a healthy body weight or to lose weight. Ask what an ideal weight is for you.  Get at least 30 minutes of exercise that causes your heart to beat faster (aerobic exercise) most days of the week. Activities may  include walking, swimming, or biking.  Include exercise to strengthen your muscles (resistance exercise), such as pilates or lifting weights, as part of your weekly exercise routine. Try to do these types of exercises for 30 minutes at least 3 days a week.  Do not use any products that contain nicotine or tobacco, such as cigarettes and e-cigarettes. If you need help quitting, ask your health care provider.  Monitor your blood pressure at home as told by your health care provider.  Keep all follow-up visits as told by your health care provider. This is important. Medicines  Take over-the-counter and prescription medicines only as told by your health care provider. Follow directions carefully. Blood pressure medicines must be taken as prescribed.  Do not skip doses of blood pressure medicine. Doing this puts you at risk for problems and can make the medicine less effective.  Ask your health care provider about side effects or reactions to medicines that you should watch for. Contact a health care provider if:  You think you  are having a reaction to a medicine you are taking.  You have headaches that keep coming back (recurring).  You feel dizzy.  You have swelling in your ankles.  You have trouble with your vision. Get help right away if:  You develop a severe headache or confusion.  You have unusual weakness or numbness.  You feel faint.  You have severe pain in your chest or abdomen.  You vomit repeatedly.  You have trouble breathing. Summary  Hypertension is when the force of blood pumping through your arteries is too strong. If this condition is not controlled, it may put you at risk for serious complications.  Your personal target blood pressure may vary depending on your medical conditions, your age, and other factors. For most people, a normal blood pressure is less than 120/80.  Hypertension is treated with lifestyle changes, medicines, or a combination of both.  Lifestyle changes include weight loss, eating a healthy, low-sodium diet, exercising more, and limiting alcohol. This information is not intended to replace advice given to you by your health care provider. Make sure you discuss any questions you have with your health care provider. Document Released: 08/11/2005 Document Revised: 07/09/2016 Document Reviewed: 07/09/2016 Elsevier Interactive Patient Education  2019 [employer].

## 2018-10-04 ENCOUNTER — Telehealth: Payer: Self-pay | Admitting: Family Medicine

## 2018-10-04 MED ORDER — PRAVASTATIN SODIUM 80 MG PO TABS: 80.0000 mg | ORAL_TABLET | Freq: Every day | ORAL | 1 refills | Status: DC

## 2018-10-04 NOTE — Progress Notes (Signed)
Cardiology Office Note   Date:  10/05/2018   ID:  Dalton AceDarrell Lavin, DOB 11-21-1955, MRN [id number]  PCP:  Bing NeighborsHarris, Kimberly S, FNP  Cardiologist:   No primary care provider on file. Referring:  Bing NeighborsHarris, Kimberly S, FNP  Chief Complaint  Patient presents with  . Shortness of Breath      History of Present Illness: Dalton Williams is a 63 y.o. male who is referred by Bing NeighborsHarris, Kimberly S, FNP for evaluation of SOB and LVH.  He has had somewhat difficult to control hypertension he is also had dyspnea with exertion.  He is never had any cardiac work-up.  He says he will get short of breath doing something like lifting water bottles at the store if they are heavy.  He was a little short of breath getting up off the exam table today.  However, he does not describe PND.  He does have sleep on several pillows which has been chronic.  He does not describe palpitations, presyncope or syncope.  He has no PND or orthopnea.  His weights have been relatively stable.  He has had no new edema.  He is mentally handicapped and lives with his mother.  He does do some walking for exercise.  He is had some poorly controlled blood sugar but this is been better as he is been eating better and exercising more.   Past Medical History:  Diagnosis Date  . Essential hypertension   . Hyperlipidemia associated with type 2 diabetes mellitus (HCC)   . Mental retardation   . Uncontrolled type 2 diabetes mellitus without complication, with long-term current use of insulin (HCC)     History reviewed. No pertinent surgical history.   Current Outpatient Medications  Medication Sig Dispense Refill  . amLODipine (NORVASC) 10 MG tablet Take 1 tablet by mouth daily.    Marland Kitchen. aspirin 81 MG chewable tablet Chew 81 mg by mouth daily.    . carvedilol (COREG) 6.25 MG tablet Take 1 tablet (6.25 mg total) by mouth 2 (two) times daily with a meal. 60 tablet 3  . insulin NPH Human (HUMULIN N,NOVOLIN N) 100 UNIT/ML injection 40  units qam, 35 units qpm    . Insulin Syringe-Needle U-100 (BD INSULIN SYRINGE U/F) 31G X 5/16" 0.5 ML MISC USE AS DIRECTED FOR INSULIN INJECTIONS TWICE DAILY    . irbesartan (AVAPRO) 300 MG tablet Take 300 mg by mouth daily.  6  . metFORMIN (GLUCOPHAGE) 500 MG tablet TAKE 2 TABLETS(1000 MG) BY MOUTH TWICE DAILY WITH MEALS    . Multiple Vitamin (MULTIVITAMIN) tablet Take 1 tablet by mouth daily.    Marland Kitchen. oxybutynin (DITROPAN) 5 MG tablet Take 5 mg by mouth 2 (two) times daily.  3  . pravastatin (PRAVACHOL) 80 MG tablet Take 1 tablet (80 mg total) by mouth daily. 90 tablet 1  . spironolactone (ALDACTONE) 25 MG tablet Take 1 tablet (25 mg total) by mouth daily. 30 tablet 6   No current facility-administered medications for this visit.     Allergies:   Penicillins    Social History:  The patient  reports that he has never smoked. He has never used smokeless tobacco. He reports that he does not drink alcohol or use drugs.   Family History:  The patient's family history includes Chronic Renal Failure in his brother; Diabetes in his brother, mother, and sister; Kidney failure in his father; Stroke in his brother.    ROS:  Please see the history of present illness.  Otherwise, review of systems are positive for none.   All other systems are reviewed and negative.    PHYSICAL EXAM: VS:  BP (!) 152/70   Pulse 64   Ht 5\' 3"  (1.6 m)   Wt 216 lb (98 kg)   SpO2 96%   BMI 38.26 kg/m  , BMI Body mass index is 38.26 kg/m. GENERAL:  Well appearing HEENT:  Pupils equal round and reactive, fundi not visualized, oral mucosa unremarkable NECK:  No jugular venous distention, waveform within normal limits, carotid upstroke brisk and symmetric, no bruits, no thyromegaly LYMPHATICS:  No cervical, inguinal adenopathy LUNGS:  Clear to auscultation bilaterally BACK:  No CVA tenderness CHEST:  Unremarkable HEART:  PMI not displaced or sustained,S1 and S2 within normal limits, no S3, positive S4, no clicks, no  rubs, no murmurs ABD:  Flat, positive bowel sounds normal in frequency in pitch, no bruits, no rebound, no guarding, no midline pulsatile mass, no hepatomegaly, no splenomegaly EXT:  2 plus pulses throughout, no edema, no cyanosis no clubbing SKIN:  No rashes no nodules NEURO:  Cranial nerves II through XII grossly intact, motor grossly intact throughout  PSYCH:  Pleasant with mental retardation.     EKG:  EKG is ordered today. The ekg ordered today demonstrates sinus rhythm, rate 64, left axis deviation, voltage criteria for left ventricle hypertrophy, borderline first-degree AV block, lateral T wave inversions consistent with possible subendocardial ischemia.   Recent Labs: No results found for requested labs within last 8760 hours.    Lipid Panel No results found for: CHOL, TRIG, HDL, CHOLHDL, VLDL, LDLCALC, LDLDIRECT    Wt Readings from Last 3 Encounters:  10/05/18 216 lb (98 kg)  09/30/18 213 lb 12.8 oz (97 kg)  07/06/16 205 lb (93 kg)      Other studies Reviewed: Additional studies/ records that were reviewed today include: Labs. Review of the above records demonstrates:  Please see elsewhere in the note.     ASSESSMENT AND PLAN:  DOE:   We will start with a BNP level when patient comes back for his other blood work.  I will have a low threshold to repeat an echocardiogram after I see him back.  I suspect part of this might be weight and deconditioning.  I am not strongly expecting ischemia.  HTN: I talked with mom about keeping a blood pressure diary.  And can start spironolactone 25 mg daily.  He will come back in 1 week for his basic metabolic profile and his BNP.  DM: His blood sugar was not well controlled previously with an A1c of 9.5 but more recently in December was 7.8.  He will continue with the meds as listed.  DYSLIPIDEMIA: He has an LDL of 58 and HDL 59.  No change in therapy.   Current medicines are reviewed at length with the patient today.  The  patient does not have concerns regarding medicines.  The following changes have been made:  As above  Labs/ tests ordered today include:   Orders Placed This Encounter  Procedures  . B Nat Peptide  . Basic Metabolic Panel (BMET)  . Basic Metabolic Panel (BMET)  . Basic Metabolic Panel (BMET)  . EKG 12-Lead     Disposition:   FU with in one month.     Signed, Rollene Rotunda, MD  10/05/2018 3:15 PM    [facility]

## 2018-10-04 NOTE — Telephone Encounter (Signed)
Patient would like a refill of pravastatin (PRAVACHOL) 80 MG tablet [[id number]]  PCP has never prescribed this medication to patient, he was seen 09/30/2018.

## 2018-10-05 ENCOUNTER — Ambulatory Visit: Payer: Medicare Other | Admitting: Cardiology

## 2018-10-05 ENCOUNTER — Encounter: Payer: Self-pay | Admitting: Cardiology

## 2018-10-05 VITALS — BP 152/70 | HR 64 | Ht 63.0 in | Wt 216.0 lb

## 2018-10-05 DIAGNOSIS — E119 Type 2 diabetes mellitus without complications: Secondary | ICD-10-CM | POA: Insufficient documentation

## 2018-10-05 DIAGNOSIS — I10 Essential (primary) hypertension: Secondary | ICD-10-CM | POA: Diagnosis not present

## 2018-10-05 DIAGNOSIS — R0602 Shortness of breath: Secondary | ICD-10-CM | POA: Insufficient documentation

## 2018-10-05 DIAGNOSIS — E785 Hyperlipidemia, unspecified: Secondary | ICD-10-CM

## 2018-10-05 DIAGNOSIS — Z79899 Other long term (current) drug therapy: Secondary | ICD-10-CM

## 2018-10-05 DIAGNOSIS — I517 Cardiomegaly: Secondary | ICD-10-CM | POA: Insufficient documentation

## 2018-10-05 MED ORDER — SPIRONOLACTONE 25 MG PO TABS: 25.0000 mg | ORAL_TABLET | Freq: Every day | ORAL | 6 refills | Status: DC

## 2018-10-05 NOTE — Patient Instructions (Addendum)
Medication Instructions:  START- Spironolactone 25 mg daily  If you need a refill on your cardiac medications before your next appointment, please call your pharmacy.  Labwork: BNP and BMP in 3-4 days, BMP in 1 week, BMP in 1 Month,  HERE IN OUR OFFICE AT [facility]   You will NOT need to fast   Take the provided lab slips with you to the lab for your blood draw.   When you have your labs (blood work) drawn today and your tests are completely normal, you will receive your results only by MyChart Message (if you have MyChart) -OR-  A paper copy in the mail.  If you have any lab test that is abnormal or we need to change your treatment, we will call you to review these results.  Testing/Procedures: None Ordered  Special Instructions: Keep a daily blood pressure diary  Follow-Up: . Your physician recommends that you schedule a follow-up appointment in: 1 Month   At [facility], you and your health needs are our priority.  As part of our continuing mission to provide you with exceptional heart care, we have created designated Provider Care Teams.  These Care Teams include your primary Cardiologist (physician) and Advanced Practice Providers (APPs -  Physician Assistants and Nurse Practitioners) who all work together to provide you with the care you need, when you need it.   Thank you for choosing [facility] at [facility]!!

## 2018-10-11 ENCOUNTER — Encounter: Payer: Self-pay | Admitting: Cardiology

## 2018-11-04 NOTE — Progress Notes (Signed)
Cardiology Office Note   Date:  11/05/2018   ID:  Dalton Williams, DOB 03-18-1956, MRN [id number]  PCP:  Bing Neighbors, FNP  Cardiologist:   No primary care provider on file. Referring:  Bing Neighbors, FNP  Chief Complaint  Patient presents with  . DOE      History of Present Illness: Dalton Williams is a 63 y.o. male who is referred by Bing Neighbors, FNP for evaluation of SOB and LVH.  He has had somewhat difficult to control hypertension he is also had dyspnea with exertion.  At the last visit I added spironolactone to his medical regimen.  Because of developmental delays it is somewhat difficult to get a history from him.  However, he walks routinely 30 minutes a day.  Apparently there is a halo along his walker he has to stop about 3 times going up that he will although he tends to minimize the symptoms.  He is not describing new PND though he does sleep chronically on 3 pillows or so.  He has had some mild leg swelling.  He is not describing chest pressure, neck or arm discomfort.  Is not describing palpitations, presyncope or syncope.    Past Medical History:  Diagnosis Date  . Essential hypertension   . Hyperlipidemia associated with type 2 diabetes mellitus (HCC)   . Mental retardation   . Uncontrolled type 2 diabetes mellitus without complication, with long-term current use of insulin (HCC)     History reviewed. No pertinent surgical history.   Current Outpatient Medications  Medication Sig Dispense Refill  . amLODipine (NORVASC) 10 MG tablet Take 1 tablet by mouth daily.    Marland Kitchen aspirin 81 MG chewable tablet Chew 81 mg by mouth daily.    . carvedilol (COREG) 6.25 MG tablet Take 1 tablet (6.25 mg total) by mouth 2 (two) times daily with a meal. 60 tablet 3  . insulin NPH Human (HUMULIN N,NOVOLIN N) 100 UNIT/ML injection 40 units qam, 35 units qpm    . Insulin Syringe-Needle U-100 (BD INSULIN SYRINGE U/F) 31G X 5/16" 0.5 ML MISC USE AS DIRECTED FOR  INSULIN INJECTIONS TWICE DAILY    . irbesartan (AVAPRO) 300 MG tablet Take 300 mg by mouth daily.  6  . metFORMIN (GLUCOPHAGE) 500 MG tablet TAKE 2 TABLETS(1000 MG) BY MOUTH TWICE DAILY WITH MEALS    . Multiple Vitamin (MULTIVITAMIN) tablet Take 1 tablet by mouth daily.    Marland Kitchen oxybutynin (DITROPAN) 5 MG tablet Take 5 mg by mouth 2 (two) times daily.  3  . pravastatin (PRAVACHOL) 80 MG tablet Take 1 tablet (80 mg total) by mouth daily. 90 tablet 1  . spironolactone (ALDACTONE) 50 MG tablet Take 1 tablet (50 mg total) by mouth daily. 90 tablet 3   No current facility-administered medications for this visit.     Allergies:   Penicillins    ROS:  Please see the history of present illness.   Otherwise, review of systems are positive for none.   All other systems are reviewed and negative.    PHYSICAL EXAM: VS:  BP (!) 179/92   Pulse 73   Ht 5\' 3"  (1.6 m)   Wt 219 lb (99.3 kg)   SpO2 97%   BMI 38.79 kg/m  , BMI Body mass index is 38.79 kg/m. GENERAL:  Well appearing NECK:  No jugular venous distention, waveform within normal limits, carotid upstroke brisk and symmetric, no bruits, no thyromegaly LUNGS:  Clear to auscultation bilaterally  CHEST:  Unremarkable HEART:  PMI not displaced or sustained,S1 and S2 within normal limits, no S3, positive S4, no clicks, no rubs, no murmurs ABD:  Flat, positive bowel sounds normal in frequency in pitch, no bruits, no rebound, no guarding, no midline pulsatile mass, no hepatomegaly, no splenomegaly EXT:  2 plus pulses throughout, mild bilateral leg edema, no cyanosis no clubbing   EKG:  EKG is not ordered today.  Recent Labs: No results found for requested labs within last 8760 hours.    Lipid Panel No results found for: CHOL, TRIG, HDL, CHOLHDL, VLDL, LDLCALC, LDLDIRECT    Wt Readings from Last 3 Encounters:  11/05/18 219 lb (99.3 kg)  10/05/18 216 lb (98 kg)  09/30/18 213 lb 12.8 oz (97 kg)      Other studies Reviewed: Additional  studies/ records that were reviewed today include: BP diary Review of the above records demonstrates:  See below   ASSESSMENT AND PLAN:  DOE:   The patient gives a more complete history of shortness of breath as described above.  He does have an S4.  I am going to check a BNP level when he comes back and order an echocardiogram.    HTN:   Blood pressure remains elevated and I reviewed the blood pressure diary.  I am getting increase his spironolactone to 50 mg twice daily.  Come back in about a week for his echocardiogram and get a basic metabolic profile.  I talked with mom about keeping a blood pressure diary.  And can start spironolactone 25 mg daily.  He will come back in 1 week for his basic metabolic profile and his BNP.  DM: His hemoglobin A1c most recently was 7.8.  I will defer to Bing Neighbors, FNP   DYSLIPIDEMIA:   He had an LDL of 58 and HDL of 59.  No change in therapy.    Current medicines are reviewed at length with the patient today.  The patient does not have concerns regarding medicines.  The following changes have been made:   As above  Labs/ tests ordered today include:   Orders Placed This Encounter  Procedures  . Basic Metabolic Panel (BMET)  . B Nat Peptide  . ECHOCARDIOGRAM COMPLETE     Disposition:   FU with in 2 months    Signed, Rollene Rotunda, MD  11/05/2018 5:49 PM    [facility]

## 2018-11-05 ENCOUNTER — Ambulatory Visit: Payer: Medicare Other | Admitting: Cardiology

## 2018-11-05 ENCOUNTER — Other Ambulatory Visit: Payer: Self-pay

## 2018-11-05 ENCOUNTER — Encounter: Payer: Self-pay | Admitting: Cardiology

## 2018-11-05 VITALS — BP 179/92 | HR 73 | Ht 63.0 in | Wt 219.0 lb

## 2018-11-05 DIAGNOSIS — I10 Essential (primary) hypertension: Secondary | ICD-10-CM | POA: Diagnosis not present

## 2018-11-05 DIAGNOSIS — Z79899 Other long term (current) drug therapy: Secondary | ICD-10-CM | POA: Insufficient documentation

## 2018-11-05 DIAGNOSIS — R0602 Shortness of breath: Secondary | ICD-10-CM | POA: Diagnosis not present

## 2018-11-05 MED ORDER — SPIRONOLACTONE 50 MG PO TABS: 50.0000 mg | ORAL_TABLET | Freq: Every day | ORAL | 3 refills | Status: DC

## 2018-11-05 NOTE — Patient Instructions (Signed)
Medication Instructions:  INCREASE- Spironolactone 50 mg daily  If you need a refill on your cardiac medications before your next appointment, please call your pharmacy.  Labwork: BMP and BNP 1 week HERE IN OUR OFFICE AT [facility]  You will NOT need to fast   Take the provided lab slips with you to the lab for your blood draw.   When you have your labs (blood work) drawn today and your tests are completely normal, you will receive your results only by MyChart Message (if you have MyChart) -OR-  A paper copy in the mail.  If you have any lab test that is abnormal or we need to change your treatment, we will call you to review these results.  Testing/Procedures: Your physician has requested that you have an echocardiogram in 1 week. Echocardiography is a painless test that uses sound waves to create images of your heart. It provides your doctor with information about the size and shape of your heart and how well your heart's chambers and valves are working. This procedure takes approximately one hour. There are no restrictions for this procedure.   Follow-Up: You will need a follow up appointment in 2 months.  Please call our office 2 months in advance to schedule this appointment.  You may see Dr Antoine Poche or one of the following Advanced Practice Providers on your designated Care Team:   Theodore Demark, PA-C . Joni Reining, DNP, ANP    At [facility], you and your health needs are our priority.  As part of our continuing mission to provide you with exceptional heart care, we have created designated Provider Care Teams.  These Care Teams include your primary Cardiologist (physician) and Advanced Practice Providers (APPs -  Physician Assistants and Nurse Practitioners) who all work together to provide you with the care you need, when you need it.  Thank you for choosing [facility] at [facility]!!

## 2018-11-09 ENCOUNTER — Other Ambulatory Visit (HOSPITAL_COMMUNITY): Payer: Medicare Other

## 2018-11-09 ENCOUNTER — Ambulatory Visit (HOSPITAL_COMMUNITY): Payer: Medicare Other

## 2018-11-11 ENCOUNTER — Ambulatory Visit: Payer: Medicare Other | Admitting: Family Medicine

## 2018-12-24 ENCOUNTER — Ambulatory Visit: Payer: Medicare Other | Admitting: Family Medicine

## 2018-12-29 ENCOUNTER — Telehealth: Payer: Self-pay

## 2018-12-29 NOTE — Telephone Encounter (Signed)
Contacted patient to convert office visit into virtual visit if agreed.  Was unable to reach patient every time I would call it would ring then hang up and this is the only number we have on file

## 2019-01-10 ENCOUNTER — Telehealth: Payer: Medicare Other | Admitting: Cardiology

## 2019-01-19 ENCOUNTER — Telehealth: Payer: Self-pay

## 2019-01-19 ENCOUNTER — Ambulatory Visit: Payer: Medicare Other | Admitting: Family Medicine

## 2019-01-19 NOTE — Telephone Encounter (Signed)

## 2019-01-20 ENCOUNTER — Other Ambulatory Visit: Payer: Self-pay

## 2019-01-20 ENCOUNTER — Ambulatory Visit (INDEPENDENT_AMBULATORY_CARE_PROVIDER_SITE_OTHER): Payer: Medicare Other | Admitting: Family Medicine

## 2019-01-20 ENCOUNTER — Encounter: Payer: Self-pay | Admitting: Family Medicine

## 2019-01-20 VITALS — BP 155/80 | HR 68 | Temp 98.1°F | Resp 17 | Ht 63.0 in | Wt 212.2 lb

## 2019-01-20 DIAGNOSIS — Z794 Long term (current) use of insulin: Secondary | ICD-10-CM

## 2019-01-20 DIAGNOSIS — Z1211 Encounter for screening for malignant neoplasm of colon: Secondary | ICD-10-CM

## 2019-01-20 DIAGNOSIS — E1169 Type 2 diabetes mellitus with other specified complication: Secondary | ICD-10-CM

## 2019-01-20 DIAGNOSIS — I517 Cardiomegaly: Secondary | ICD-10-CM

## 2019-01-20 DIAGNOSIS — E1165 Type 2 diabetes mellitus with hyperglycemia: Secondary | ICD-10-CM

## 2019-01-20 DIAGNOSIS — I10 Essential (primary) hypertension: Secondary | ICD-10-CM

## 2019-01-20 DIAGNOSIS — Z1329 Encounter for screening for other suspected endocrine disorder: Secondary | ICD-10-CM

## 2019-01-20 MED ORDER — SPIRONOLACTONE 50 MG PO TABS: 100.0000 mg | ORAL_TABLET | Freq: Every day | ORAL | 3 refills | Status: DC

## 2019-01-20 NOTE — Patient Instructions (Signed)
Managing Your Hypertension Hypertension is commonly called high blood pressure. This is when the force of your blood pressing against the walls of your arteries is too strong. Arteries are blood vessels that carry blood from your heart throughout your body. Hypertension forces the heart to work harder to pump blood, and may cause the arteries to become narrow or stiff. Having untreated or uncontrolled hypertension can cause heart attack, stroke, kidney disease, and other problems. What are blood pressure readings? A blood pressure reading consists of a higher number over a lower number. Ideally, your blood pressure should be below 120/80. The first ("top") number is called the systolic pressure. It is a measure of the pressure in your arteries as your heart beats. The second ("bottom") number is called the diastolic pressure. It is a measure of the pressure in your arteries as the heart relaxes. What does my blood pressure reading mean? Blood pressure is classified into four stages. Based on your blood pressure reading, your health care provider may use the following stages to determine what type of treatment you need, if any. Systolic pressure and diastolic pressure are measured in a unit called mm Hg. Normal  Systolic pressure: below 254.  Diastolic pressure: below 80. Elevated  Systolic pressure: 982-641.  Diastolic pressure: below 80. Hypertension stage 1  Systolic pressure: 583-094.  Diastolic pressure: 07-68. Hypertension stage 2  Systolic pressure: 088 or above.  Diastolic pressure: 90 or above. What health risks are associated with hypertension? Managing your hypertension is an important responsibility. Uncontrolled hypertension can lead to:  A heart attack.  A stroke.  A weakened blood vessel (aneurysm).  Heart failure.  Kidney damage.  Eye damage.  Metabolic syndrome.  Memory and concentration problems. What changes can I make to manage my hypertension?  Hypertension can be managed by making lifestyle changes and possibly by taking medicines. Your health care provider will help you make a plan to bring your blood pressure within a normal range. Eating and drinking   Eat a diet that is high in fiber and potassium, and low in salt (sodium), added sugar, and fat. An example eating plan is called the DASH (Dietary Approaches to Stop Hypertension) diet. To eat this way: ? Eat plenty of fresh fruits and vegetables. Try to fill half of your plate at each meal with fruits and vegetables. ? Eat whole grains, such as whole wheat pasta, brown rice, or whole grain bread. Fill about one quarter of your plate with whole grains. ? Eat low-fat diary products. ? Avoid fatty cuts of meat, processed or cured meats, and poultry with skin. Fill about one quarter of your plate with lean proteins such as fish, chicken without skin, beans, eggs, and tofu. ? Avoid premade and processed foods. These tend to be higher in sodium, added sugar, and fat.  Reduce your daily sodium intake. Most people with hypertension should eat less than 1,500 mg of sodium a day.  Limit alcohol intake to no more than 1 drink a day for nonpregnant women and 2 drinks a day for men. One drink equals 12 oz of beer, 5 oz of wine, or 1 oz of hard liquor. Lifestyle  Work with your health care provider to maintain a healthy body weight, or to lose weight. Ask what an ideal weight is for you.  Get at least 30 minutes of exercise that causes your heart to beat faster (aerobic exercise) most days of the week. Activities may include walking, swimming, or biking.  Include  exercise to strengthen your muscles (resistance exercise), such as weight lifting, as part of your weekly exercise routine. Try to do these types of exercises for 30 minutes at least 3 days a week.  Do not use any products that contain nicotine or tobacco, such as cigarettes and e-cigarettes. If you need help quitting, ask your health  care provider.  Control any long-term (chronic) conditions you have, such as high cholesterol or diabetes. Monitoring  Monitor your blood pressure at home as told by your health care provider. Your personal target blood pressure may vary depending on your medical conditions, your age, and other factors.  Have your blood pressure checked regularly, as often as told by your health care provider. Working with your health care provider  Review all the medicines you take with your health care provider because there may be side effects or interactions.  Talk with your health care provider about your diet, exercise habits, and other lifestyle factors that may be contributing to hypertension.  Visit your health care provider regularly. Your health care provider can help you create and adjust your plan for managing hypertension. Will I need medicine to control my blood pressure? Your health care provider may prescribe medicine if lifestyle changes are not enough to get your blood pressure under control, and if:  Your systolic blood pressure is 130 or higher.  Your diastolic blood pressure is 80 or higher. Take medicines only as told by your health care provider. Follow the directions carefully. Blood pressure medicines must be taken as prescribed. The medicine does not work as well when you skip doses. Skipping doses also puts you at risk for problems. Contact a health care provider if:  You think you are having a reaction to medicines you have taken.  You have repeated (recurrent) headaches.  You feel dizzy.  You have swelling in your ankles.  You have trouble with your vision. Get help right away if:  You develop a severe headache or confusion.  You have unusual weakness or numbness, or you feel faint.  You have severe pain in your chest or abdomen.  You vomit repeatedly.  You have trouble breathing. Summary  Hypertension is when the force of blood pumping through your arteries  is too strong. If this condition is not controlled, it may put you at risk for serious complications.  Your personal target blood pressure may vary depending on your medical conditions, your age, and other factors. For most people, a normal blood pressure is less than 120/80.  Hypertension is managed by lifestyle changes, medicines, or both. Lifestyle changes include weight loss, eating a healthy, low-sodium diet, exercising more, and limiting alcohol. This information is not intended to replace advice given to you by your health care provider. Make sure you discuss any questions you have with your health care provider. Document Released: 05/05/2012 Document Revised: 07/09/2016 Document Reviewed: 07/09/2016 Elsevier Interactive Patient Education  2019 [employer]   Stool DNA Testing for Colon Cancer Why am I having this test? Colon cancer is the second-leading cause of cancer deaths in men and women. Testing for colon cancer before symptoms develop (screening) reduces your risk for this cancer. Stool DNA testing, also called the FIT-DNA test, is one method of screening for colon cancer. Colon cancer grows slowly, so finding the cancer early means a better chance for effective treatment. All adults should have colon cancer screening starting at age 47 and continuing until age 65. Your health care provider may recommend screening at age  53. Screening may include having stool DNA testing every 3 years if:  You have no symptoms of colon cancer. Symptoms include rectal bleeding, weight loss, and changes in bowel habits.  You have an average risk of colon cancer. Average risk means: ? You do not have precancerous polyps. ? You do not have family or personal history of either colon cancer or a colon disease that increases your risk for colon cancer. What is being tested? For the test, a sample of your stool (feces) is checked for blood and changes in DNA that could lead to cancer. Growths in your  colon that are cancerous (malignant) or may become cancerous (precancerous polyps) bleed and shed cells. Blood and cells can be picked up by stool as it passes through your colon. This test checks for blood cells as well as nine types of DNA (biomarkers) in three genes that have been linked to colon cancer and precancerous polyps. What kind of sample is taken?  This test uses a stool sample that is collected when you have a bowel movement. How do I collect samples at home? You will be sent a stool sample collection kit in the mail. It will include instructions and everything you need to get the sample. Instructions may include these steps:  Store the kit in a dry place at room temperature until you are ready to collect the sample. Keep the kit away from heat and sunlight.  To collect the sample, place the collection device over the toilet.  Collect the sample according to the instructions that came with your kit.  After collecting a stool sample, follow instructions carefully regarding mixing the stool with a preservative and sealing the sample.  Send the sample to the lab in the postage-paid box that was included in the kit. Your stool sample will be checked within 3 days. The results will be sent to your health care provider. How do I prepare for this test? There is no preparation required for this test. However, do not collect a stool sample if:  You have bleeding hemorrhoids.  You have any rectal bleeding.  You have a cut on your hand or finger.  You have your menstrual period.  You have diarrhea. How are the results reported? Your test results will be reported as either positive or negative. It is up to you to get your test results. Ask your health care provider, or the department that is doing the test, when your results will be ready. What do the results mean?  A positive result means that the test found abnormal DNA, blood cells, or both. If you have a positive result, you  will need to have a follow-up exam of your colon done with a scope (colonoscopy).  A negative result means that no blood or changes in DNA were found. This does not guarantee that you do not have colon cancer. Your health care provider may recommend that you have other screening tests. Talk with your health care provider to discuss your results, treatment options, and if necessary, the need for more tests. Talk with your health care provider if you have any questions about your results. This information is not intended to replace advice given to you by your health care provider. Make sure you discuss any questions you have with your health care provider. Document Released: 05/01/2016 Document Revised: 10/01/2017 Document Reviewed: 05/01/2016 Elsevier Interactive Patient Education  2019 [employer].

## 2019-01-20 NOTE — Progress Notes (Signed)
Patient ID: Dalton Williams, male    DOB: 10-Apr-1956, 63 y.o.   MRN: [id number]  PCP: Bing Neighbors, FNP  Chief Complaint  Patient presents with  . Diabetes  . Hypertension  . Hyperlipidemia    Subjective:  HPI Dalton Williams is a 63 y.o. male presents for hypertension and diabetes follow-up.  Dalton Williams has Diabetes mellitus (HCC); Left ventricular hypertrophy; SOB (shortness of breath); Essential hypertension; and Medication management on their problem list.   Hypertension Dalton Williams is present today accompanied by his mother who is his primary caregiver. His blood pressure is monitored daily. He is hypertensive today. Current medication regimen includes: irbesartan, carvedilol, furosemide, spirolactone. Blood pressure readings recently have been in 150's systolic and 80's diastolic.Dalton Williams is followed by cardiology as he suffers from LVH and has had dyspnea in the past. He is currently symptomatic of shortness of breath. Endorses daily physical exercise with walking daily for approximately 30 minutes per day. Current Body mass index is 37.59 kg/m.  Diabetes Monitors blood sugar daily. Last A1C 7.8. Current home readings between 120-150's.Denies neuropathic pain, polyuria, polydipsia, and polyphagia. Current regimen includes metformin and humulin. He is a non-smoker. Limits heavy starches. Denies hypoglycemia.  Social History   Socioeconomic History  . Marital status: Single    Spouse name: Not on file  . Number of children: Not on file  . Years of education: Not on file  . Highest education level: Not on file  Occupational History  . Not on file  Social Needs  . Financial resource strain: Not on file  . Food insecurity:    Worry: Not on file    Inability: Not on file  . Transportation needs:    Medical: Not on file    Non-medical: Not on file  Tobacco Use  . Smoking status: Never Smoker  . Smokeless tobacco: Never Used  Substance and Sexual Activity  . Alcohol use:  No  . Drug use: No  . Sexual activity: Not on file  Lifestyle  . Physical activity:    Days per week: Not on file    Minutes per session: Not on file  . Stress: Not on file  Relationships  . Social connections:    Talks on phone: Not on file    Gets together: Not on file    Attends religious service: Not on file    Active member of club or organization: Not on file    Attends meetings of clubs or organizations: Not on file    Relationship status: Not on file  . Intimate partner violence:    Fear of current or ex partner: Not on file    Emotionally abused: Not on file    Physically abused: Not on file    Forced sexual activity: Not on file  Other Topics Concern  . Not on file  Social History Narrative  . Not on file    Family History  Problem Relation Age of Onset  . Diabetes Mother   . Kidney failure Father   . Diabetes Sister   . Stroke Brother   . Diabetes Brother   . Chronic Renal Failure Brother    Review of Systems  Pertinent negatives listed in HPI Allergies  Allergen Reactions  . Penicillins Rash    Prior to Admission medications   Medication Sig Start Date End Date Taking? Authorizing Provider  amLODipine (NORVASC) 10 MG tablet Take 1 tablet by mouth daily. 07/16/18  Yes [provider]  aspirin 81 MG chewable tablet  Chew 81 mg by mouth daily.   Yes [provider]  carvedilol (COREG) 6.25 MG tablet Take 1 tablet (6.25 mg total) by mouth 2 (two) times daily with a meal. 09/30/18  Yes Bing NeighborsHarris, Yousuf Ager S, FNP  insulin NPH Human (HUMULIN N,NOVOLIN N) 100 UNIT/ML injection 40 units qam, 35 units qpm 01/11/18  Yes [provider]  Insulin Syringe-Needle U-100 (BD INSULIN SYRINGE U/F) 31G X 5/16" 0.5 ML MISC USE AS DIRECTED FOR INSULIN INJECTIONS TWICE DAILY 05/28/18  Yes [provider]  irbesartan (AVAPRO) 300 MG tablet Take 300 mg by mouth daily. 06/03/16  Yes [provider]  metFORMIN (GLUCOPHAGE) 500 MG tablet TAKE 2  TABLETS(1000 MG) BY MOUTH TWICE DAILY WITH MEALS 10/23/17  Yes [provider]  Multiple Vitamin (MULTIVITAMIN) tablet Take 1 tablet by mouth daily.   Yes [provider]  oxybutynin (DITROPAN) 5 MG tablet Take 5 mg by mouth 2 (two) times daily. 04/24/16  Yes [provider]  pravastatin (PRAVACHOL) 80 MG tablet Take 1 tablet (80 mg total) by mouth daily. 10/04/18  Yes Bing NeighborsHarris, Toney Difatta S, FNP  spironolactone (ALDACTONE) 50 MG tablet Take 1 tablet (50 mg total) by mouth daily. 11/05/18 02/03/19 Yes Rollene RotundaHochrein, James, MD    Past Medical, Surgical Family and Social History reviewed and updated.    Objective:   Today's Vitals   01/20/19 1052  BP: (!) 155/80  Pulse: 68  Resp: 17  Temp: 98.1 F (36.7 C)  TempSrc: Temporal  SpO2: 96%  Weight: 212 lb 3.2 oz (96.3 kg)  Height: 5\' 3"  (1.6 m)    BP Readings from Last 3 Encounters:  01/20/19 (!) 155/80  11/05/18 (!) 179/92  10/05/18 (!) 152/70    Filed Weights   01/20/19 1052  Weight: 212 lb 3.2 oz (96.3 kg)     Physical Exam General appearance: alert, well developed, well nourished, cooperative and in no distress Head: Normocephalic, without obvious abnormality, atraumatic Respiratory: Respirations even and unlabored, normal respiratory rate Heart: rate and rhythm normal. No gallop or murmurs noted on exam  Abdomen: BS +, no distention, no rebound tenderness, or no mass Extremities: No gross deformities Skin: Skin color, texture, turgor normal. No rashes seen  Psych: Appropriate mood and affect. Neurologic: Mental status: Alert, oriented to person, place, and time, thought content appropriate. Lab Results  Component Value Date   POCGLU 159 (A) 09/30/2018    No results found for: HGBA1C   Assessment & Plan:  1. Type 2 diabetes mellitus with other specified complication, with long-term current use of insulin (HCC) Last A1C 7.8. Repeating A1C today.  Aim for 30 minutes of exercise most days, with a goal of  150 minutes per week. -Glucose monitoring at minimal of twice daily and keep a log of readings. -Commit to medication adherence and self-adjustment as needed -increase foods containing whole grains (one-half of grain intake). -saturated fat intake should be reduced -reduce intake of trans fat (lowers LDL cholesterol and increases HDL cholesterol) -Eat 4-5 small meals during the day to reduce the risk of becoming hungry. - Comprehensive metabolic panel - Hemoglobin A1c  2. Essential hypertension, uncontrolled -Continue current BP medications -Increasing Spirolactone 100 mg daily   3. Left ventricular hypertrophy -Continue follow-up with cardiology.  Asymptomatic of shortness of breath.   4. Colon cancer screening - Cologuard  5. Screening for thyroid disorder - Thyroid Panel With TSH   Return to clinic BP check 3 weeks and schedule a 3 month diabetes/ hypertension follow-up.  Joaquin Courts, FNP-C Primary Care at [facility] [address], [place] [address] [phone]: [phone]

## 2019-01-21 ENCOUNTER — Telehealth: Payer: Self-pay | Admitting: Family Medicine

## 2019-01-21 LAB — COMPREHENSIVE METABOLIC PANEL
ALT: 19 IU/L (ref 0–44)
AST: 23 IU/L (ref 0–40)
Albumin/Globulin Ratio: 2 (ref 1.2–2.2)
Albumin: 4.9 g/dL — ABNORMAL HIGH (ref 3.8–4.8)
Alkaline Phosphatase: 94 IU/L (ref 39–117)
BUN/Creatinine Ratio: 17 (ref 10–24)
BUN: 19 mg/dL (ref 8–27)
Bilirubin Total: 0.6 mg/dL (ref 0.0–1.2)
CO2: 22 mmol/L (ref 20–29)
Calcium: 10.6 mg/dL — ABNORMAL HIGH (ref 8.6–10.2)
Chloride: 98 mmol/L (ref 96–106)
Creatinine, Ser: 1.15 mg/dL (ref 0.76–1.27)
GFR calc Af Amer: 79 mL/min/{1.73_m2} (ref >59)
GFR calc non Af Amer: 68 mL/min/{1.73_m2} (ref >59)
Globulin, Total: 2.4 g/dL (ref 1.5–4.5)
Glucose: 147 mg/dL — ABNORMAL HIGH (ref 65–99)
Potassium: 5.7 mmol/L — ABNORMAL HIGH (ref 3.5–5.2)
Sodium: 138 mmol/L (ref 134–144)
Total Protein: 7.3 g/dL (ref 6.0–8.5)

## 2019-01-21 LAB — HEMOGLOBIN A1C
Est. average glucose Bld gHb Est-mCnc: 169 mg/dL
Hgb A1c MFr Bld: 7.5 % — ABNORMAL HIGH (ref 4.8–5.6)

## 2019-01-21 LAB — THYROID PANEL WITH TSH
Free Thyroxine Index: 1.5 (ref 1.2–4.9)
T3 Uptake Ratio: 24 % (ref 24–39)
T4, Total: 6.3 ug/dL (ref 4.5–12.0)
TSH: 1.82 u[IU]/mL (ref 0.450–4.500)

## 2019-01-21 MED ORDER — FUROSEMIDE 20 MG PO TABS: 20.0000 mg | ORAL_TABLET | Freq: Every day | ORAL | 3 refills | Status: DC

## 2019-01-21 NOTE — Telephone Encounter (Signed)
Spoke with patient's mom Dalton Williams to advise patient's potassium was elevated at 5.7.  Patient is prescribed spironolactone as well as ibesartan which are both potassium sparing medications.  I am adding furosemide 20 mg once daily in hopes of stabilizing his potassium level.  He will return to the office in 2 weeks for repeat potassium level. Also provided other lab results from yesterday.  Patient's mom verbalized understanding.  No changes to current medication regimen.

## 2019-01-25 ENCOUNTER — Telehealth: Payer: Self-pay | Admitting: Family Medicine

## 2019-01-25 NOTE — Telephone Encounter (Signed)
Please schedule lab appointment for 6/8.

## 2019-01-27 ENCOUNTER — Telehealth (HOSPITAL_COMMUNITY): Payer: Self-pay | Admitting: Radiology

## 2019-01-27 NOTE — Telephone Encounter (Signed)
Left message with mother to call office-Patient needs to schedule an echocardiogram.

## 2019-01-28 ENCOUNTER — Other Ambulatory Visit: Payer: Self-pay

## 2019-01-28 MED ORDER — CARVEDILOL 6.25 MG PO TABS: 6.2500 mg | ORAL_TABLET | Freq: Two times a day (BID) | ORAL | 3 refills | Status: DC

## 2019-01-31 ENCOUNTER — Other Ambulatory Visit (INDEPENDENT_AMBULATORY_CARE_PROVIDER_SITE_OTHER): Payer: Medicare Other

## 2019-01-31 ENCOUNTER — Other Ambulatory Visit: Payer: Self-pay

## 2019-01-31 VITALS — BP 148/76 | HR 81

## 2019-01-31 DIAGNOSIS — Z0130 Encounter for examination of blood pressure without abnormal findings: Secondary | ICD-10-CM

## 2019-01-31 DIAGNOSIS — I10 Essential (primary) hypertension: Secondary | ICD-10-CM

## 2019-01-31 DIAGNOSIS — E875 Hyperkalemia: Secondary | ICD-10-CM

## 2019-01-31 NOTE — Progress Notes (Signed)
Patient here for BP check & potassium recheck. After sitting patient's BP was 148/76, pulse 81. Spoke with provider & she states to continue all medications and to keep follow up appointment on 03/03/2019. KWalker, CMA.

## 2019-02-01 LAB — POTASSIUM: Potassium: 5 mmol/L (ref 3.5–5.2)

## 2019-02-03 ENCOUNTER — Telehealth (HOSPITAL_COMMUNITY): Payer: Self-pay | Admitting: Radiology

## 2019-02-03 NOTE — Telephone Encounter (Signed)
Called to schedule echocardiogram-spoke with mother, she states her son is special needs. At this time, they can not afford the echocardiogram. She will call back when they are able to afford the test.

## 2019-02-12 LAB — COLOGUARD
Cologuard: NEGATIVE
Cologuard: NEGATIVE

## 2019-03-02 ENCOUNTER — Telehealth: Payer: Self-pay

## 2019-03-02 NOTE — Telephone Encounter (Signed)

## 2019-03-03 ENCOUNTER — Other Ambulatory Visit: Payer: Self-pay

## 2019-03-03 ENCOUNTER — Ambulatory Visit (INDEPENDENT_AMBULATORY_CARE_PROVIDER_SITE_OTHER): Payer: Medicare Other | Admitting: Family Medicine

## 2019-03-03 ENCOUNTER — Encounter: Payer: Self-pay | Admitting: Family Medicine

## 2019-03-03 VITALS — BP 102/72 | HR 61 | Temp 97.3°F | Resp 17 | Ht 63.0 in | Wt 208.6 lb

## 2019-03-03 DIAGNOSIS — I517 Cardiomegaly: Secondary | ICD-10-CM

## 2019-03-03 DIAGNOSIS — I10 Essential (primary) hypertension: Secondary | ICD-10-CM

## 2019-03-03 MED ORDER — SPIRONOLACTONE 50 MG PO TABS: 50.0000 mg | ORAL_TABLET | Freq: Every day | ORAL | 3 refills | Status: DC

## 2019-03-03 NOTE — Progress Notes (Deleted)
Patient ID: Dalton Williams, male    DOB: August 10, 1956, 63 y.o.   MRN: [id number]  PCP: Bing NeighborsHarris, Antonin Meininger S, FNP  Chief Complaint  Patient presents with  . Hypertension    Subjective:  HPI  Dalton Williams is a 63 y.o. male presents for evaluation    Social History   Socioeconomic History  . Marital status: Single    Spouse name: Not on file  . Number of children: Not on file  . Years of education: Not on file  . Highest education level: Not on file  Occupational History  . Not on file  Social Needs  . Financial resource strain: Not on file  . Food insecurity    Worry: Not on file    Inability: Not on file  . Transportation needs    Medical: Not on file    Non-medical: Not on file  Tobacco Use  . Smoking status: Never Smoker  . Smokeless tobacco: Never Used  Substance and Sexual Activity  . Alcohol use: No  . Drug use: No  . Sexual activity: Not on file  Lifestyle  . Physical activity    Days per week: Not on file    Minutes per session: Not on file  . Stress: Not on file  Relationships  . Social Musicianconnections    Talks on phone: Not on file    Gets together: Not on file    Attends religious service: Not on file    Active member of club or organization: Not on file    Attends meetings of clubs or organizations: Not on file    Relationship status: Not on file  . Intimate partner violence    Fear of current or ex partner: Not on file    Emotionally abused: Not on file    Physically abused: Not on file    Forced sexual activity: Not on file  Other Topics Concern  . Not on file  Social History Narrative  . Not on file    Family History  Problem Relation Age of Onset  . Diabetes Mother   . Kidney failure Father   . Diabetes Sister   . Stroke Brother   . Diabetes Brother   . Chronic Renal Failure Brother      Review of Systems  Patient Active Problem List   Diagnosis Date Noted  . Medication management 11/05/2018  . Diabetes mellitus (HCC)  10/05/2018  . Left ventricular hypertrophy 10/05/2018  . SOB (shortness of breath) 10/05/2018  . Essential hypertension 10/05/2018    Allergies  Allergen Reactions  . Penicillins Rash    Prior to Admission medications   Medication Sig Start Date End Date Taking? Authorizing Provider  amLODipine (NORVASC) 10 MG tablet Take 1 tablet by mouth daily. 07/16/18  Yes [provider]  aspirin 81 MG chewable tablet Chew 81 mg by mouth daily.   Yes [provider]  carvedilol (COREG) 6.25 MG tablet Take 1 tablet (6.25 mg total) by mouth 2 (two) times daily with a meal. 01/28/19  Yes Bing NeighborsHarris, Catlin Aycock S, FNP  furosemide (LASIX) 20 MG tablet Take 1 tablet (20 mg total) by mouth daily. 01/21/19  Yes Bing NeighborsHarris, Latria Mccarron S, FNP  insulin NPH Human (HUMULIN N,NOVOLIN N) 100 UNIT/ML injection 40 units qam, 35 units qpm 01/11/18  Yes [provider]  Insulin Syringe-Needle U-100 (BD INSULIN SYRINGE U/F) 31G X 5/16" 0.5 ML MISC USE AS DIRECTED FOR INSULIN INJECTIONS TWICE DAILY 05/28/18  Yes [provider]  irbesartan Evlyn Kanner(AVAPRO)  300 MG tablet Take 300 mg by mouth daily. 06/03/16  Yes [provider]  metFORMIN (GLUCOPHAGE) 500 MG tablet TAKE 2 TABLETS(1000 MG) BY MOUTH TWICE DAILY WITH MEALS 10/23/17  Yes [provider]  Multiple Vitamin (MULTIVITAMIN) tablet Take 1 tablet by mouth daily.   Yes [provider]  oxybutynin (DITROPAN) 5 MG tablet Take 5 mg by mouth 2 (two) times daily. 04/24/16  Yes [provider]  pravastatin (PRAVACHOL) 80 MG tablet Take 1 tablet (80 mg total) by mouth daily. 10/04/18  Yes Scot Jun, FNP  spironolactone (ALDACTONE) 50 MG tablet Take 1 tablet (50 mg total) by mouth daily. 03/03/19 08/30/19 Yes Scot Jun, FNP    Past Medical, Surgical Family and Social History reviewed and updated.    Objective:   Today's Vitals   03/03/19 1053  BP: 102/72  Pulse: 61  Resp: 17  Temp: (!) 97.3 F (36.3 C)   TempSrc: Temporal  SpO2: 97%  Weight: 208 lb 9.6 oz (94.6 kg)  Height: 5\' 3"  (1.6 m)    Wt Readings from Last 3 Encounters:  03/03/19 208 lb 9.6 oz (94.6 kg)  01/20/19 212 lb 3.2 oz (96.3 kg)  11/05/18 219 lb (99.3 kg)     Physical Exam General appearance: alert, well developed, well nourished, cooperative and in no distress Head: Normocephalic, without obvious abnormality, atraumatic Respiratory: Respirations even and unlabored, normal respiratory rate Heart: rate and rhythm normal. No gallop or murmurs noted on exam  Extremities: No gross deformities Skin: Skin color, texture, turgor normal. No rashes seen  Psych: Appropriate mood and affect. Neurologic: Mental status: Alert, oriented to person, place, and time, thought content appropriate.  Lab Results  Component Value Date   POCGLU 159 (A) 09/30/2018    Lab Results  Component Value Date   HGBA1C 7.5 (H) 01/20/2019            Assessment & Plan:  There are no diagnoses linked to this encounter.     -The patient was given clear instructions to go to ER or return to medical center if symptoms do not improve, worsen or new problems develop. The patient verbalized understanding.    Molli Barrows, FNP Primary Care at [facility] [address] [place] [phone]: [phone]

## 2019-03-03 NOTE — Patient Instructions (Signed)

## 2019-03-08 NOTE — Progress Notes (Signed)
Subjective:    Patient here for follow-up of elevated blood pressure.  He is exercising and is not adherent to a low-salt diet.  Blood pressure is well controlled at home. Cardiac symptoms: none. Patient denies: chest pain, chest pressure/discomfort, dyspnea, lower extremity edema and palpitations. Cardiovascular risk factors: advanced age (older than 54 for men, 50 for women), diabetes mellitus, dyslipidemia, family history of premature cardiovascular disease, hypertension, male gender and obesity (BMI >= 30 kg/m2). Use of agents associated with hypertension: none. History of target organ damage: left ventricular hypertrophy, followed by cardiology.  Review of Systems Pertinent items are noted in HPI.     Objective:    BP 102/72   Pulse 61   Temp (!) 97.3 F (36.3 C) (Temporal)   Resp 17   Ht 5\' 3"  (1.6 m)   Wt 208 lb 9.6 oz (94.6 kg)   SpO2 97%   BMI 36.95 kg/m  Lungs: clear to auscultation bilaterally Heart: regular rate and rhythm, S1, S2 normal, no murmur, click, rub or gallop   Neurological: A&O x 3 Assessment:    Hypertension, normal blood pressure today. Recent addition with Spirolactone has improved overall blood pressure control.   Plan:  Continue all medications as prescribed. Continue efforts to reduce sodium. Continue engaging in exercise to facilitate weight loss. Return for follow-up of hypertension and diabetes in October.   Molli Barrows, FNP Primary Care at [facility] [address], [place] [phone]: [phone]

## 2019-03-22 NOTE — Progress Notes (Signed)
Patient notified of Cologuard results during last office visit.

## 2019-03-31 ENCOUNTER — Other Ambulatory Visit: Payer: Self-pay

## 2019-03-31 MED ORDER — PRAVASTATIN SODIUM 80 MG PO TABS: 80.0000 mg | ORAL_TABLET | Freq: Every day | ORAL | 0 refills | Status: DC

## 2019-04-06 ENCOUNTER — Telehealth: Payer: Self-pay | Admitting: Family Medicine

## 2019-04-06 MED ORDER — OXYBUTYNIN CHLORIDE 5 MG PO TABS: 5.0000 mg | ORAL_TABLET | Freq: Two times a day (BID) | ORAL | 0 refills | Status: DC

## 2019-04-06 NOTE — Telephone Encounter (Signed)
1) Medication(s) Requested (by name): Oxydutynin 5MG    2) Pharmacy of Choice: [facility] on [address]   3) Special Requests:   Approved medications will be sent to the pharmacy, we will reach out if there is an issue.  Requests made after 3pm may not be addressed until the following business day!  If a patient is unsure of the name of the medication(s) please note and ask patient to call back when they are able to provide all info, do not send to responsible party until all information is available!

## 2019-04-06 NOTE — Telephone Encounter (Signed)
Rx sent 

## 2019-04-26 ENCOUNTER — Telehealth: Payer: Self-pay | Admitting: Family Medicine

## 2019-04-26 MED ORDER — AMLODIPINE BESYLATE 10 MG PO TABS: 10.0000 mg | ORAL_TABLET | Freq: Every day | ORAL | 0 refills | Status: DC

## 2019-04-26 MED ORDER — IRBESARTAN 300 MG PO TABS: 300.0000 mg | ORAL_TABLET | Freq: Every day | ORAL | 0 refills | Status: DC

## 2019-04-26 NOTE — Telephone Encounter (Signed)
Patients mother came in to request a medication refill for the following medications please follow up -amLODipine (NORVASC) 10 MG tablet  -irbesartan (AVAPRO) 300 MG tablet  To [id number] - [place], [place] - [address]

## 2019-04-26 NOTE — Telephone Encounter (Signed)
Rx's sent to pharmacy.  

## 2019-05-09 ENCOUNTER — Other Ambulatory Visit: Payer: Self-pay

## 2019-05-09 MED ORDER — SPIRONOLACTONE 50 MG PO TABS: 50.0000 mg | ORAL_TABLET | Freq: Every day | ORAL | 3 refills | Status: DC

## 2019-05-09 NOTE — Telephone Encounter (Signed)
Refill

## 2019-05-10 ENCOUNTER — Other Ambulatory Visit: Payer: Self-pay

## 2019-05-11 ENCOUNTER — Other Ambulatory Visit: Payer: Self-pay

## 2019-05-11 MED ORDER — SPIRONOLACTONE 50 MG PO TABS: 50.0000 mg | ORAL_TABLET | Freq: Every day | ORAL | 2 refills | Status: DC

## 2019-05-12 ENCOUNTER — Other Ambulatory Visit: Payer: Self-pay

## 2019-05-12 MED ORDER — SPIRONOLACTONE 50 MG PO TABS: 50.0000 mg | ORAL_TABLET | Freq: Every day | ORAL | 2 refills | Status: DC

## 2019-05-16 ENCOUNTER — Other Ambulatory Visit: Payer: Self-pay

## 2019-05-16 NOTE — Telephone Encounter (Signed)
Refill

## 2019-05-17 ENCOUNTER — Other Ambulatory Visit: Payer: Self-pay

## 2019-05-20 ENCOUNTER — Other Ambulatory Visit: Payer: Self-pay

## 2019-05-20 MED ORDER — FUROSEMIDE 20 MG PO TABS: 20.0000 mg | ORAL_TABLET | Freq: Every day | ORAL | 0 refills | Status: DC

## 2019-06-03 ENCOUNTER — Telehealth: Payer: Self-pay

## 2019-06-03 ENCOUNTER — Other Ambulatory Visit: Payer: Self-pay

## 2019-06-03 MED ORDER — CARVEDILOL 6.25 MG PO TABS: 6.2500 mg | ORAL_TABLET | Freq: Two times a day (BID) | ORAL | 0 refills | Status: DC

## 2019-06-03 NOTE — Telephone Encounter (Signed)

## 2019-06-06 ENCOUNTER — Ambulatory Visit (INDEPENDENT_AMBULATORY_CARE_PROVIDER_SITE_OTHER): Payer: Medicare Other | Admitting: Family Medicine

## 2019-06-06 ENCOUNTER — Encounter: Payer: Self-pay | Admitting: Family Medicine

## 2019-06-06 ENCOUNTER — Other Ambulatory Visit: Payer: Self-pay

## 2019-06-06 VITALS — BP 124/83 | HR 60 | Temp 97.3°F | Resp 17 | Ht 63.0 in | Wt 209.4 lb

## 2019-06-06 DIAGNOSIS — Z23 Encounter for immunization: Secondary | ICD-10-CM

## 2019-06-06 DIAGNOSIS — Z79899 Other long term (current) drug therapy: Secondary | ICD-10-CM

## 2019-06-06 DIAGNOSIS — E1169 Type 2 diabetes mellitus with other specified complication: Secondary | ICD-10-CM | POA: Diagnosis not present

## 2019-06-06 DIAGNOSIS — E785 Hyperlipidemia, unspecified: Secondary | ICD-10-CM

## 2019-06-06 DIAGNOSIS — I10 Essential (primary) hypertension: Secondary | ICD-10-CM | POA: Diagnosis not present

## 2019-06-06 DIAGNOSIS — Z794 Long term (current) use of insulin: Secondary | ICD-10-CM

## 2019-06-06 MED ORDER — "INSULIN SYRINGE-NEEDLE U-100 31G X 5/16"" 0.5 ML MISC": 1 refills | Status: DC

## 2019-06-06 NOTE — Patient Instructions (Signed)
Diabetes Mellitus and Foot Care Foot care is an important part of your health, especially when you have diabetes. Diabetes may cause you to have problems because of poor blood flow (circulation) to your feet and legs, which can cause your skin to:  Become thinner and drier.  Break more easily.  Heal more slowly.  Peel and crack. You may also have nerve damage (neuropathy) in your legs and feet, causing decreased feeling in them. This means that you may not notice minor injuries to your feet that could lead to more serious problems. Noticing and addressing any potential problems early is the best way to prevent future foot problems. How to care for your feet Foot hygiene  Wash your feet daily with warm water and mild soap. Do not use hot water. Then, pat your feet and the areas between your toes until they are completely dry. Do not soak your feet as this can dry your skin.  Trim your toenails straight across. Do not dig under them or around the cuticle. File the edges of your nails with an emery board or nail file.  Apply a moisturizing lotion or petroleum jelly to the skin on your feet and to dry, brittle toenails. Use lotion that does not contain alcohol and is unscented. Do not apply lotion between your toes. Shoes and socks  Wear clean socks or stockings every day. Make sure they are not too tight. Do not wear knee-high stockings since they may decrease blood flow to your legs.  Wear shoes that fit properly and have enough cushioning. Always look in your shoes before you put them on to be sure there are no objects inside.  To break in new shoes, wear them for just a few hours a day. This prevents injuries on your feet. Wounds, scrapes, corns, and calluses  Check your feet daily for blisters, cuts, bruises, sores, and redness. If you cannot see the bottom of your feet, use a mirror or ask someone for help.  Do not cut corns or calluses or try to remove them with medicine.  If you  find a minor scrape, cut, or break in the skin on your feet, keep it and the skin around it clean and dry. You may clean these areas with mild soap and water. Do not clean the area with peroxide, alcohol, or iodine.  If you have a wound, scrape, corn, or callus on your foot, look at it several times a day to make sure it is healing and not infected. Check for: ? Redness, swelling, or pain. ? Fluid or blood. ? Warmth. ? Pus or a bad smell. General instructions  Do not cross your legs. This may decrease blood flow to your feet.  Do not use heating pads or hot water bottles on your feet. They may burn your skin. If you have lost feeling in your feet or legs, you may not know this is happening until it is too late.  Protect your feet from hot and cold by wearing shoes, such as at the beach or on hot pavement.  Schedule a complete foot exam at least once a year (annually) or more often if you have foot problems. If you have foot problems, report any cuts, sores, or bruises to your health care provider immediately. Contact a health care provider if:  You have a medical condition that increases your risk of infection and you have any cuts, sores, or bruises on your feet.  You have an injury that is not   healing.  You have redness on your legs or feet.  You feel burning or tingling in your legs or feet.  You have pain or cramps in your legs and feet.  Your legs or feet are numb.  Your feet always feel cold.  You have pain around a toenail. Get help right away if:  You have a wound, scrape, corn, or callus on your foot and: ? You have pain, swelling, or redness that gets worse. ? You have fluid or blood coming from the wound, scrape, corn, or callus. ? Your wound, scrape, corn, or callus feels warm to the touch. ? You have pus or a bad smell coming from the wound, scrape, corn, or callus. ? You have a fever. ? You have a red line going up your leg. Summary  Check your feet every day  for cuts, sores, red spots, swelling, and blisters.  Moisturize feet and legs daily.  Wear shoes that fit properly and have enough cushioning.  If you have foot problems, report any cuts, sores, or bruises to your health care provider immediately.  Schedule a complete foot exam at least once a year (annually) or more often if you have foot problems. This information is not intended to replace advice given to you by your health care provider. Make sure you discuss any questions you have with your health care provider. Document Released: 08/08/2000 Document Revised: 09/23/2017 Document Reviewed: 09/12/2016 Elsevier Patient Education  2020 Elsevier Inc.  

## 2019-06-06 NOTE — Progress Notes (Signed)
Will get flu shot today.  Had eye exam 03/10/2019 with Dr. Gershon Crane.

## 2019-06-06 NOTE — Progress Notes (Signed)
Established Patient Office Visit  Subjective:  Patient ID: Dalton Williams, male    DOB: 01/18/1956  Age: 63 y.o. MRN: [id number]  CC:  Chief Complaint  Patient presents with  . Diabetes  . Hypertension  . Hyperlipidemia    HPI [name] presents for follow-up of chronic medical issues for continued medical management.  Patient with diabetes with most recent hemoglobin A1c done 01/20/2019 with hemoglobin A1c of 7.5 at that time.  Patient also at that time had CMP with hyperkalemia with potassium of 5.7.  Repeat potassium on 01/31/2019 was normal at 5.0.  Patient is currently on amlodipine 10 mg, carvedilol, Lasix, spironolactone and Avapro for hypertension.  Per chart, patient is a cardiology on 11/05/2018 but may have missed follow-up due to COVID-19 pandemic.       At today's visit, patient reports that he feels well, patient with history of intellectual disability and is accompanied by his mother today's visit.  Mother states that patient did have his initial follow-up with cardiology and then due to the COVID-19 pandemic additional follow-up appointment was rescheduled then mother was told that at the next visit they would need to make sure that no one else was in the elevator with them to help decrease COVID spread and mother was concerned therefore she did not schedule additional follow-up with cardiology.  Patient shortness of breath has resolved per patient's report.  He also denies any issues with swelling in his legs.  He continues to take his blood pressure medications daily and medications are administered by his mother.          Mother reports that his home blood sugars are generally in the 130s or less fasting.  He has had no significant hypoglycemic episodes.  He has had no headaches or dizziness.  He is taking all medication on a daily basis.  He does take a daily aspirin 81 mg as well and has had no unusual bruising or bleeding. Mother would like for patient to have his  influenza immunization at today's visit.  Patient had diabetic eye exam earlier this year with Dr. [name] on 03/10/2019.  Patient does have cataracts but there is no plan to have any surgical intervention at this time.  Past Medical History:  Diagnosis Date  . Essential hypertension   . Hyperlipidemia associated with type 2 diabetes mellitus (HCC)   . Mental retardation   . Uncontrolled type 2 diabetes mellitus without complication, with long-term current use of insulin     No past surgical history on file.  Family History  Problem Relation Age of Onset  . Diabetes Mother   . Kidney failure Father   . Diabetes Sister   . Stroke Brother   . Diabetes Brother   . Chronic Renal Failure Brother     Social History   Socioeconomic History  . Marital status: Single    Spouse name: Not on file  . Number of children: Not on file  . Years of education: Not on file  . Highest education level: Not on file  Occupational History  . Not on file  Social Needs  . Financial resource strain: Not on file  . Food insecurity    Worry: Not on file    Inability: Not on file  . Transportation needs    Medical: Not on file    Non-medical: Not on file  Tobacco Use  . Smoking status: Never Smoker  . Smokeless tobacco: Never Used  Substance and Sexual Activity  .  Alcohol use: No  . Drug use: No  . Sexual activity: Not on file  Lifestyle  . Physical activity    Days per week: Not on file    Minutes per session: Not on file  . Stress: Not on file  Relationships  . Social Musician on phone: Not on file    Gets together: Not on file    Attends religious service: Not on file    Active member of club or organization: Not on file    Attends meetings of clubs or organizations: Not on file    Relationship status: Not on file  . Intimate partner violence    Fear of current or ex partner: Not on file    Emotionally abused: Not on file    Physically abused: Not on file    Forced  sexual activity: Not on file  Other Topics Concern  . Not on file  Social History Narrative  . Not on file    Outpatient Medications Prior to Visit  Medication Sig Dispense Refill  . amLODipine (NORVASC) 10 MG tablet Take 1 tablet (10 mg total) by mouth daily. 90 tablet 0  . aspirin 81 MG chewable tablet Chew 81 mg by mouth daily.    . carvedilol (COREG) 6.25 MG tablet Take 1 tablet (6.25 mg total) by mouth 2 (two) times daily with a meal. 180 tablet 0  . furosemide (LASIX) 20 MG tablet Take 1 tablet (20 mg total) by mouth daily. 90 tablet 0  . insulin NPH Human (HUMULIN N,NOVOLIN N) 100 UNIT/ML injection 40 units qam, 35 units qpm    . Insulin Syringe-Needle U-100 (BD INSULIN SYRINGE U/F) 31G X 5/16" 0.5 ML MISC USE AS DIRECTED FOR INSULIN INJECTIONS TWICE DAILY    . irbesartan (AVAPRO) 300 MG tablet Take 1 tablet (300 mg total) by mouth daily. 90 tablet 0  . metFORMIN (GLUCOPHAGE) 500 MG tablet TAKE 2 TABLETS(1000 MG) BY MOUTH TWICE DAILY WITH MEALS    . Multiple Vitamin (MULTIVITAMIN) tablet Take 1 tablet by mouth daily.    Marland Kitchen oxybutynin (DITROPAN) 5 MG tablet Take 1 tablet (5 mg total) by mouth 2 (two) times daily. 180 tablet 0  . pravastatin (PRAVACHOL) 80 MG tablet Take 1 tablet (80 mg total) by mouth daily. 90 tablet 0  . spironolactone (ALDACTONE) 50 MG tablet Take 1 tablet (50 mg total) by mouth daily. 90 tablet 2   No facility-administered medications prior to visit.     Allergies  Allergen Reactions  . Penicillins Rash    ROS Review of Systems  Constitutional: Negative for appetite change, chills, fatigue and fever.  HENT: Positive for postnasal drip (occasional) and sneezing (occasional). Negative for sore throat and trouble swallowing.   Eyes: Negative for photophobia and visual disturbance.  Respiratory: Negative for cough and shortness of breath.   Cardiovascular: Negative for chest pain, palpitations and leg swelling.  Gastrointestinal: Negative for abdominal pain,  constipation, diarrhea and nausea.  Endocrine: Negative for polydipsia, polyphagia and polyuria.  Genitourinary: Negative for dysuria and frequency.  Musculoskeletal: Negative for arthralgias, back pain, gait problem and joint swelling.  Skin: Negative for rash and wound.  Neurological: Negative for dizziness, numbness and headaches.  Hematological: Negative for adenopathy. Does not bruise/bleed easily.      Objective:    Physical Exam  Constitutional: He is oriented to person, place, and time. He appears well-developed and well-nourished.  Overweight for height/obese older male in no acute distress.  Patient  has intellectually disabled and is accompanied by his mother at today's visit  Neck: Normal range of motion. Neck supple. No JVD present. No thyromegaly present.  Cardiovascular: Normal rate and regular rhythm.  Pulmonary/Chest: Effort normal and breath sounds normal.  Abdominal: Soft. There is no abdominal tenderness. There is no rebound and no guarding.  Mild truncal obesity  Musculoskeletal: Normal range of motion.        General: No tenderness or edema.  Lymphadenopathy:    He has no cervical adenopathy.  Neurological: He is alert and oriented to person, place, and time.  Skin: Skin is warm and dry.  Psychiatric: He has a normal mood and affect.  Nursing note and vitals reviewed. Diabetic foot exam- patient with dry skin on the feet bilaterally as well as calluses on the outside of the right great toe and on the heel.  Patient with decreased sensation to monofilament on the right heel/midfoot.  Patient with 2+ dorsalis pedis and posterior tibial pulses bilaterally.  Normal monofilament exam on the left foot.  Patient with some mild thickening of the left great toenail and discoloration of half of the nail consistent with mycotic changes to the left great toenail.  Toenails are otherwise well trimmed. BP 124/83   Pulse 60   Temp (!) 97.3 F (36.3 C) (Temporal)   Resp 17   Ht   (1.6 m)   Wt 209 lb 6.4 oz (95 kg)   SpO2 97%   BMI 37.09 kg/m  Wt Readings from Last 3 Encounters:  06/06/19 209 lb 6.4 oz (95 kg)  03/03/19 208 lb 9.6 oz (94.6 kg)  01/20/19 212 lb 3.2 oz (96.3 kg)     Health Maintenance Due  Topic Date Due  . INFLUENZA VACCINE  03/26/2019  . FOOT EXAM  04/21/2019   Diabetic foot exam performed at today's visit.  Patient was also offered and received influenza immunization.  Patient's mother gave consent for patient to receive influenza immunization.   Lab Results  Component Value Date   TSH 1.820 01/20/2019   Lab Results  Component Value Date   WBC 6.6 07/06/2016   HGB 13.6 07/06/2016   HCT 43.3 07/06/2016   MCV 69.2 (L) 07/06/2016   PLT 273 07/06/2016   Lab Results  Component Value Date   NA 138 01/20/2019   K 5.0 01/31/2019   CO2 22 01/20/2019   GLUCOSE 147 (H) 01/20/2019   BUN 19 01/20/2019   CREATININE 1.15 01/20/2019   BILITOT 0.6 01/20/2019   ALKPHOS 94 01/20/2019   AST 23 01/20/2019   ALT 19 01/20/2019   PROT 7.3 01/20/2019   ALBUMIN 4.9 (H) 01/20/2019   CALCIUM 10.6 (H) 01/20/2019   ANIONGAP 11 07/06/2016   No results found for: CHOL No results found for: HDL No results found for: LDLCALC No results found for: TRIG No results found for: CHOLHDL Lab Results  Component Value Date   HGBA1C 7.5 (H) 01/20/2019      Assessment & Plan:  1. Type 2 diabetes mellitus with other specified complication, with long-term current use of insulin (HCC) Patient's most recent hemoglobin A1c was 7.5 showing good control of blood sugars.  Because patient has issues with intellectual disability, will be less aggressive in getting A1c to 7 or less in order to avoid side effects such as hypoglycemia which can increase risk of falls/confusion.  Patient will have repeat hemoglobin A1c at today's visit in addition to comprehensive metabolic panel and microalbumin creatinine ratio.  Patient will continue his current medications at  this time but mother will be notified if there are any lab abnormalities requiring further follow-up or requiring changes in current therapies - Insulin Syringe-Needle U-100 (BD INSULIN SYRINGE U/F) 31G X 5/16" 0.5 ML MISC; USE AS DIRECTED FOR INSULIN INJECTIONS TWICE DAILY  Dispense: 100 each; Refill: 1 - Comprehensive metabolic panel - Hemoglobin A1c - Microalbumin/Creatinine Ratio, Urine  2. Essential hypertension Patient with essential hypertension which is reasonably controlled and stable on his current medications including amlodipine, carvedilol, irbesartan 300 mg, Lasix and spironolactone.  Patient has had past elevated potassium and electrolytes will be repeated as part of CMP and patient will also have microalbumin creatinine ratio at today's visit. - Microalbumin/Creatinine Ratio, Urine  3. Hyperlipidemia LDL goal <70 I did not see a recent lipid panel in patient's chart on review of labs.  He will have lipid panel at today's visit and is currently on pravastatin.  Patient will have CMP in follow-up of long-term use of medication in addition to repeat lipid panel.  Continue healthy, low fat and low carbohydrate diet.  Patient is nonfasting as he had toast with sugar-free syrup prior to today's visit - Comprehensive metabolic panel - Lipid Panel  4. Encounter for long-term (current) use of medications Patient will have comprehensive metabolic panel in follow-up of long-term use of medications. - Comprehensive metabolic panel  5. Need for immunization against influenza Discussed need for influenza immunization and mother states that she would like for patient to receive this at today's visit.  Mother was provided with educational information regarding immunization and any possible side effects. - Flu Vaccine QUAD 6+ mos PF IM (Fluarix Quad PF)  An After Visit Summary was printed and given to the patient.  Follow-up: Return in about 4 months (around 10/07/2019) for DM/chronic  issues.   Antony Blackbird, MD

## 2019-06-07 LAB — COMPREHENSIVE METABOLIC PANEL WITH GFR
ALT: 20 IU/L (ref 0–44)
AST: 19 IU/L (ref 0–40)
Albumin/Globulin Ratio: 1.9 (ref 1.2–2.2)
Albumin: 4.6 g/dL (ref 3.8–4.8)
Alkaline Phosphatase: 102 IU/L (ref 39–117)
BUN/Creatinine Ratio: 12 (ref 10–24)
BUN: 12 mg/dL (ref 8–27)
Bilirubin Total: 0.8 mg/dL (ref 0.0–1.2)
CO2: 23 mmol/L (ref 20–29)
Calcium: 10.2 mg/dL (ref 8.6–10.2)
Chloride: 100 mmol/L (ref 96–106)
Creatinine, Ser: 1.04 mg/dL (ref 0.76–1.27)
GFR calc Af Amer: 89 mL/min/1.73
GFR calc non Af Amer: 77 mL/min/1.73
Globulin, Total: 2.4 g/dL (ref 1.5–4.5)
Glucose: 143 mg/dL — ABNORMAL HIGH (ref 65–99)
Potassium: 5.1 mmol/L (ref 3.5–5.2)
Sodium: 137 mmol/L (ref 134–144)
Total Protein: 7 g/dL (ref 6.0–8.5)

## 2019-06-07 LAB — HEMOGLOBIN A1C
Est. average glucose Bld gHb Est-mCnc: 157 mg/dL
Hgb A1c MFr Bld: 7.1 % — ABNORMAL HIGH (ref 4.8–5.6)

## 2019-06-07 LAB — LIPID PANEL
Chol/HDL Ratio: 2.3 ratio (ref 0.0–5.0)
Cholesterol, Total: 129 mg/dL (ref 100–199)
HDL: 56 mg/dL
LDL Chol Calc (NIH): 58 mg/dL (ref 0–99)
Triglycerides: 77 mg/dL (ref 0–149)
VLDL Cholesterol Cal: 15 mg/dL (ref 5–40)

## 2019-06-07 LAB — MICROALBUMIN / CREATININE URINE RATIO
Creatinine, Urine: 53.2 mg/dL
Microalb/Creat Ratio: <6 mg/g{creat} (ref 0–29)
Microalbumin, Urine: <3 ug/mL

## 2019-06-08 NOTE — Progress Notes (Signed)
Normal lab letter mailed.

## 2019-06-13 ENCOUNTER — Other Ambulatory Visit: Payer: Self-pay | Admitting: Family Medicine

## 2019-07-01 ENCOUNTER — Other Ambulatory Visit: Payer: Self-pay | Admitting: Family Medicine

## 2019-07-18 ENCOUNTER — Other Ambulatory Visit: Payer: Self-pay

## 2019-07-18 MED ORDER — IRBESARTAN 300 MG PO TABS: 300.0000 mg | ORAL_TABLET | Freq: Every day | ORAL | 0 refills | Status: DC

## 2019-07-18 MED ORDER — OXYBUTYNIN CHLORIDE 5 MG PO TABS: 5.0000 mg | ORAL_TABLET | Freq: Two times a day (BID) | ORAL | 0 refills | Status: DC

## 2019-07-18 MED ORDER — AMLODIPINE BESYLATE 10 MG PO TABS: 10.0000 mg | ORAL_TABLET | Freq: Every day | ORAL | 0 refills | Status: DC

## 2019-07-23 ENCOUNTER — Other Ambulatory Visit: Payer: Self-pay | Admitting: Internal Medicine

## 2019-08-19 ENCOUNTER — Other Ambulatory Visit: Payer: Self-pay | Admitting: Family Medicine

## 2019-08-23 ENCOUNTER — Other Ambulatory Visit: Payer: Self-pay | Admitting: Family Medicine

## 2019-09-02 ENCOUNTER — Other Ambulatory Visit: Payer: Self-pay | Admitting: Family Medicine

## 2019-09-02 NOTE — Telephone Encounter (Signed)
Please fill if appropriate or if office visit is needed. Last OV/lab was completed in OCT/2020

## 2019-10-06 ENCOUNTER — Encounter: Payer: Self-pay | Admitting: Family Medicine

## 2019-10-06 ENCOUNTER — Ambulatory Visit
Admission: EM | Admit: 2019-10-06 | Discharge: 2019-10-06 | Disposition: A | Discharge status: Home / Self Care | Payer: Medicare Other | Attending: Family Medicine | Admitting: Family Medicine

## 2019-10-06 ENCOUNTER — Other Ambulatory Visit: Payer: Self-pay

## 2019-10-06 ENCOUNTER — Ambulatory Visit (INDEPENDENT_AMBULATORY_CARE_PROVIDER_SITE_OTHER): Payer: Medicare Other

## 2019-10-06 DIAGNOSIS — K5900 Constipation, unspecified: Secondary | ICD-10-CM

## 2019-10-06 DIAGNOSIS — R1084 Generalized abdominal pain: Secondary | ICD-10-CM | POA: Diagnosis not present

## 2019-10-06 MED ORDER — POLYETHYLENE GLYCOL 3350 17 GM/SCOOP PO POWD: ORAL | 0 refills | Status: DC

## 2019-10-06 NOTE — Discharge Instructions (Addendum)
Take the Miralax twice daily for 3 days, then once a day for another 4 days

## 2019-10-06 NOTE — ED Triage Notes (Signed)
Pt c/o abdominal pain, abdomen distended, very gassy and no bm x1wk

## 2019-10-06 NOTE — ED Provider Notes (Addendum)
[facility]    CSN: [id number] Arrival date & time: 10/06/19  1326      History   Chief Complaint Chief Complaint  Patient presents with  . Abdominal Pain    HPI Dalton Williams is a 64 y.o. male.   Initial [facility] visit.  Brought in by mother.  29 yo man presents with abdominal discomfort.  Pt's abdomen distended, very gassy and no BM x 1wk.  Cologard negative 1 year ago.     Past Medical History:  Diagnosis Date  . Essential hypertension   . Hyperlipidemia associated with type 2 diabetes mellitus (HCC)   . Mental retardation   . Uncontrolled type 2 diabetes mellitus without complication, with long-term current use of insulin     Patient Active Problem List   Diagnosis Date Noted  . Medication management 11/05/2018  . Diabetes mellitus (HCC) 10/05/2018  . Left ventricular hypertrophy 10/05/2018  . SOB (shortness of breath) 10/05/2018  . Essential hypertension 10/05/2018    Past Surgical History:  Procedure Laterality Date  . NO PAST SURGERIES         Home Medications    Prior to Admission medications   Medication Sig Start Date End Date Taking? Authorizing Provider  amLODipine (NORVASC) 10 MG tablet Take 1 tablet (10 mg total) by mouth daily. 07/18/19   Fulp, Cammie, MD  aspirin 81 MG chewable tablet Chew 81 mg by mouth daily.    [provider]  carvedilol (COREG) 6.25 MG tablet TAKE 1 TABLET(6.25 MG) BY MOUTH TWICE DAILY WITH A MEAL 09/02/19   Hoy Register, MD  furosemide (LASIX) 20 MG tablet TAKE 1 TABLET(20 MG) BY MOUTH DAILY 08/22/19   Fulp, Cammie, MD  insulin NPH Human (HUMULIN N,NOVOLIN N) 100 UNIT/ML injection 40 units qam, 35 units qpm 01/11/18   [provider]  Insulin Syringe-Needle U-100 (BD INSULIN SYRINGE U/F) 31G X 5/16" 0.5 ML MISC USE AS DIRECTED FOR INSULIN INJECTIONS TWICE DAILY 06/06/19   Fulp, Cammie, MD  irbesartan (AVAPRO) 300 MG tablet Take 1 tablet (300 mg total) by mouth daily. 07/18/19   Fulp,  Cammie, MD  metFORMIN (GLUCOPHAGE) 500 MG tablet TAKE 2 TABLETS(1000 MG) BY MOUTH TWICE DAILY WITH MEALS 06/14/19   Fulp, Cammie, MD  Multiple Vitamin (MULTIVITAMIN) tablet Take 1 tablet by mouth daily.    [provider]  oxybutynin (DITROPAN) 5 MG tablet Take 1 tablet (5 mg total) by mouth 2 (two) times daily. 07/18/19   Fulp, Cammie, MD  polyethylene glycol powder (MIRALAX) 17 GM/SCOOP powder Take 17 gm twice a day in liquid for next three days, then once daily for another 4 days 10/06/19   Elvina Sidle, MD  pravastatin (PRAVACHOL) 80 MG tablet TAKE 1 TABLET(80 MG) BY MOUTH DAILY 07/01/19   Fulp, Cammie, MD  spironolactone (ALDACTONE) 50 MG tablet Take 1 tablet (50 mg total) by mouth daily. 05/12/19 11/08/19  Rollene Rotunda, MD    Family History Family History  Problem Relation Age of Onset  . Diabetes Mother   . Kidney failure Father   . Diabetes Sister   . Stroke Brother   . Diabetes Brother   . Chronic Renal Failure Brother     Social History Social History   Tobacco Use  . Smoking status: Never Smoker  . Smokeless tobacco: Never Used  Substance Use Topics  . Alcohol use: No  . Drug use: No     Allergies   Penicillins   Review of Systems Review of Systems  Physical Exam Triage Vital Signs ED Triage Vitals  Enc Vitals Group     BP      Pulse      Resp      Temp      Temp src      SpO2      Weight      Height      Head Circumference      Peak Flow      Pain Score      Pain Loc      Pain Edu?      Excl. in [place]?    No data found.  Updated Vital Signs BP (!) 157/68 (BP Location: Left Arm)   Pulse 81   Temp 99 F (37.2 C) (Oral)   Resp 20   SpO2 95%    Physical Exam Vitals and nursing note reviewed.  Constitutional:      General: He is not in acute distress.    Appearance: He is well-developed. He is obese. He is not ill-appearing or toxic-appearing.  HENT:     Head: Normocephalic.  Cardiovascular:     Rate and Rhythm: Normal  rate and regular rhythm.  Pulmonary:     Effort: Pulmonary effort is normal.  Abdominal:     General: Abdomen is protuberant. Bowel sounds are normal. There is distension. There are no signs of injury.     Palpations: Abdomen is soft.     Tenderness: There is abdominal tenderness.     Comments: Mild diffuse discomfort with palpation of abdomen  Skin:    General: Skin is warm and dry.  Neurological:     General: No focal deficit present.     Mental Status: He is alert.     Comments: Walks in under own power, but slow gait.  Psychiatric:        Mood and Affect: Mood normal.        Behavior: Behavior normal.      UC Treatments / Results   Radiology KUB shows large RUQ large intestinal collection of fecal matter   Initial Impression / Assessment and Plan / UC Course  I have reviewed the triage vital signs and the nursing notes.  Pertinent labs & imaging results that were available during my care of the patient were reviewed by me and considered in my medical decision making (see chart for details).    Final Clinical Impressions(s) / UC Diagnoses   Final diagnoses:  Constipation, unspecified constipation type     Discharge Instructions     Take the Miralax twice daily for 3 days, then once a day for another 4 days    ED Prescriptions    Medication Sig Dispense Auth. Provider   polyethylene glycol powder (MIRALAX) 17 GM/SCOOP powder Take 17 gm twice a day in liquid for next three days, then once daily for another 4 days 255 g Robyn Haber, MD     I have reviewed the PDMP during this encounter.   Robyn Haber, MD 10/06/19 1406    Robyn Haber, MD 10/06/19 1408

## 2019-10-07 ENCOUNTER — Other Ambulatory Visit: Payer: Self-pay

## 2019-10-07 MED ORDER — PRAVASTATIN SODIUM 80 MG PO TABS: ORAL_TABLET | ORAL | 0 refills | Status: DC

## 2019-10-10 ENCOUNTER — Ambulatory Visit (INDEPENDENT_AMBULATORY_CARE_PROVIDER_SITE_OTHER): Payer: Medicare Other | Admitting: Internal Medicine

## 2019-10-10 ENCOUNTER — Encounter: Payer: Self-pay | Admitting: Internal Medicine

## 2019-10-10 DIAGNOSIS — E785 Hyperlipidemia, unspecified: Secondary | ICD-10-CM | POA: Diagnosis not present

## 2019-10-10 DIAGNOSIS — I10 Essential (primary) hypertension: Secondary | ICD-10-CM | POA: Diagnosis not present

## 2019-10-10 DIAGNOSIS — Z794 Long term (current) use of insulin: Secondary | ICD-10-CM

## 2019-10-10 DIAGNOSIS — E1169 Type 2 diabetes mellitus with other specified complication: Secondary | ICD-10-CM

## 2019-10-10 DIAGNOSIS — K5900 Constipation, unspecified: Secondary | ICD-10-CM

## 2019-10-10 MED ORDER — INSULIN NPH (HUMAN) (ISOPHANE) 100 UNIT/ML ~~LOC~~ SUSP: SUBCUTANEOUS | Status: DC

## 2019-10-10 NOTE — Progress Notes (Signed)
Virtual Visit via Telephone Note  I connected with Dalton Williams, on 10/10/2019 at 10:51 AM by telephone due to the COVID-19 pandemic and verified that I am speaking with the correct person using two identifiers.   Consent: I discussed the limitations, risks, security and privacy concerns of performing an evaluation and management service by telephone and the availability of in person appointments. I also discussed with the patient that there may be a patient responsible charge related to this service. The patient expressed understanding and agreed to proceed.   Location of Patient: Home   Location of Provider: Clinic    Persons participating in Telemedicine visit: [place] [facility] Dr. Anette Guarneri Lippy-patient's mother      History of Present Illness: Patient has a visit up for follow up of chronic medical conditions. I spoke with patient's mother for the visit as she is his healthcare power of attorney. Patient was seen at urgent care on 2/11 for abdominal pain, found to have significant constipation. Started on Miralax regimen. Patient is now having some PO liquid intake but not much solid intake. Was previously not drinking fluids at all.   Diabetes mellitus, Type 2 Disease Monitoring             Blood Sugar Ranges: Fasting - 120-130              Polyuria: no              Visual problems: no   Urine Microalbumin <6 (Oct 2020)   Last A1C: 7.1 (Oct 2020)   Medications: Metformin 1000 mg BID, Takes Novolin 4 Medication Compliance: yes  Medication Side Effects             Hypoglycemia: no   Preventitive Health Care             Eye Exam: UTD             Foot Exam: UTD    Chronic HTN Disease Monitoring:  Home BP Monitoring - No  Chest pain- no  Dyspnea- no Headache - no  Medications: Amlodipine 10 mg, Lasix 20 mg daily, Irbesartan 300 mg daily, Spironolactone 50 mg, Coreg 6.25 mg BID  Compliance- yes Lightheadedness- no   Edema- no       Past Medical History:  Diagnosis Date  . Essential hypertension   . Hyperlipidemia associated with type 2 diabetes mellitus (HCC)   . Mental retardation   . Uncontrolled type 2 diabetes mellitus without complication, with long-term current use of insulin    Allergies  Allergen Reactions  . Penicillins Rash    Current Outpatient Medications on File Prior to Visit  Medication Sig Dispense Refill  . amLODipine (NORVASC) 10 MG tablet Take 1 tablet (10 mg total) by mouth daily. 90 tablet 0  . aspirin 81 MG chewable tablet Chew 81 mg by mouth daily.    . carvedilol (COREG) 6.25 MG tablet TAKE 1 TABLET(6.25 MG) BY MOUTH TWICE DAILY WITH A MEAL 180 tablet 0  . furosemide (LASIX) 20 MG tablet TAKE 1 TABLET(20 MG) BY MOUTH DAILY 90 tablet 0  . insulin NPH Human (HUMULIN N,NOVOLIN N) 100 UNIT/ML injection 40 units qam, 35 units qpm    . Insulin Syringe-Needle U-100 (BD INSULIN SYRINGE U/F) 31G X 5/16" 0.5 ML MISC USE AS DIRECTED FOR INSULIN INJECTIONS TWICE DAILY 100 each 1  . irbesartan (AVAPRO) 300 MG tablet Take 1 tablet (300 mg total) by mouth daily. 90 tablet 0  . metFORMIN (  GLUCOPHAGE) 500 MG tablet TAKE 2 TABLETS(1000 MG) BY MOUTH TWICE DAILY WITH MEALS 360 tablet 1  . Multiple Vitamin (MULTIVITAMIN) tablet Take 1 tablet by mouth daily.    Marland Kitchen oxybutynin (DITROPAN) 5 MG tablet Take 1 tablet (5 mg total) by mouth 2 (two) times daily. 180 tablet 0  . polyethylene glycol powder (MIRALAX) 17 GM/SCOOP powder Take 17 gm twice a day in liquid for next three days, then once daily for another 4 days 255 g 0  . pravastatin (PRAVACHOL) 80 MG tablet TAKE 1 TABLET(80 MG) BY MOUTH DAILY 90 tablet 0  . spironolactone (ALDACTONE) 50 MG tablet Take 1 tablet (50 mg total) by mouth daily. 90 tablet 2   No current facility-administered medications on file prior to visit.    Observations/Objective: NAD. Speaking clearly.  Work of breathing normal.  Alert and oriented. Mood  appropriate.   Assessment and Plan: 1. Type 2 diabetes mellitus with other specified complication, with long-term current use of insulin (HCC) A1c essentially at goal last check with value of 7.1%. Fasting CBGs in good range. Discussed with mother I would feel comfortable having repeat A1c in 3 months to avoid coming in just for a lab visit. She is in agreement.  Counseled on Diabetic diet, my plate method, 130 minutes of moderate intensity exercise/week Blood sugar logs with fasting goals of 80-120 mg/dl, random of less than 180 and in the event of sugars less than 60 mg/dl or greater than 400 mg/dl encouraged to notify the clinic. Advised on the need for annual eye exams, annual foot exams, Pneumonia vaccine.  2. Hyperlipidemia, unspecified hyperlipidemia type Continue pravastatin.   3. Essential hypertension Does not monitor BP at home. Return in 3 months for recheck. Continue current regimen.   4. Constipation, unspecified constipation type Patient on a Miralax clean out plan. PO intake remains low. Advised to hold Metformin during this due to risk of lactic acidosis especially given the Miralax may lead to some degree of dehydration as well. Resume once is tolerating normal PO intake.    Follow Up Instructions: Return in 3 months for chronic medical conditions.    I discussed the assessment and treatment plan with the patient. The patient was provided an opportunity to ask questions and all were answered. The patient agreed with the plan and demonstrated an understanding of the instructions.   The patient was advised to call back or seek an in-person evaluation if the symptoms worsen or if the condition fails to improve as anticipated.     I provided 18 minutes total of non-face-to-face time during this encounter including median intraservice time, reviewing previous notes, investigations, ordering medications, medical decision making, coordinating care and patient verbalized  understanding at the end of the visit.    Phill Myron, D.O. Primary Care at [facility]  10/10/2019, 10:51 AM

## 2019-10-19 ENCOUNTER — Other Ambulatory Visit: Payer: Self-pay

## 2019-10-19 MED ORDER — IRBESARTAN 300 MG PO TABS: 300.0000 mg | ORAL_TABLET | Freq: Every day | ORAL | 0 refills | Status: DC

## 2019-10-19 MED ORDER — OXYBUTYNIN CHLORIDE 5 MG PO TABS: 5.0000 mg | ORAL_TABLET | Freq: Two times a day (BID) | ORAL | 0 refills | Status: DC

## 2019-10-19 MED ORDER — AMLODIPINE BESYLATE 10 MG PO TABS: 10.0000 mg | ORAL_TABLET | Freq: Every day | ORAL | 0 refills | Status: DC

## 2019-11-21 ENCOUNTER — Other Ambulatory Visit: Payer: Self-pay

## 2019-11-21 MED ORDER — CARVEDILOL 6.25 MG PO TABS: ORAL_TABLET | ORAL | 1 refills | Status: DC

## 2019-11-21 MED ORDER — FUROSEMIDE 20 MG PO TABS: ORAL_TABLET | ORAL | 1 refills | Status: DC

## 2019-11-21 MED ORDER — METFORMIN HCL 500 MG PO TABS: ORAL_TABLET | ORAL | 1 refills | Status: DC

## 2020-01-11 ENCOUNTER — Other Ambulatory Visit: Payer: Self-pay | Admitting: Cardiology

## 2020-01-11 MED ORDER — AMLODIPINE BESYLATE 10 MG PO TABS: 10.0000 mg | ORAL_TABLET | Freq: Every day | ORAL | 0 refills | Status: DC

## 2020-01-11 MED ORDER — OXYBUTYNIN CHLORIDE 5 MG PO TABS: 5.0000 mg | ORAL_TABLET | Freq: Two times a day (BID) | ORAL | 0 refills | Status: DC

## 2020-01-11 MED ORDER — IRBESARTAN 300 MG PO TABS: 300.0000 mg | ORAL_TABLET | Freq: Every day | ORAL | 0 refills | Status: DC

## 2020-01-11 MED ORDER — PRAVASTATIN SODIUM 80 MG PO TABS: ORAL_TABLET | ORAL | 0 refills | Status: DC

## 2020-01-11 NOTE — Addendum Note (Signed)
Addended by: Heidi Dach on: 01/11/2020 03:03 PM   Modules accepted: Orders

## 2020-01-11 NOTE — Telephone Encounter (Signed)
1) Medication(s) Requested (by name):pravastatin (PRAVACHOL) 80 MG tablet [[id number]]    2) Pharmacy of Choice:[id number] G[place] Otto,  - [address] AT [facility] & [address]  [address] [place], [place] [address]   3) Special Requests:   Approved medications will be sent to the pharmacy, we will reach out if there is an issue.  Requests made after 3pm may not be addressed until the following business day!  If a patient is unsure of the name of the medication(s) please note and ask patient to call back when they are able to provide all info, do not send to responsible party until all information is available!

## 2020-01-13 ENCOUNTER — Telehealth: Payer: Self-pay

## 2020-01-13 NOTE — Telephone Encounter (Signed)
Called patient to do their pre-visit COVID screening.  Call went to voicemail. Unable to do prescreening.  

## 2020-01-16 ENCOUNTER — Encounter: Payer: Self-pay | Admitting: Internal Medicine

## 2020-01-16 ENCOUNTER — Ambulatory Visit (INDEPENDENT_AMBULATORY_CARE_PROVIDER_SITE_OTHER): Payer: Medicare Other | Admitting: Internal Medicine

## 2020-01-16 ENCOUNTER — Other Ambulatory Visit: Payer: Self-pay

## 2020-01-16 VITALS — BP 125/71 | HR 55 | Temp 97.5°F | Resp 17 | Ht 63.0 in | Wt 186.0 lb

## 2020-01-16 DIAGNOSIS — I10 Essential (primary) hypertension: Secondary | ICD-10-CM | POA: Diagnosis not present

## 2020-01-16 DIAGNOSIS — Z794 Long term (current) use of insulin: Secondary | ICD-10-CM

## 2020-01-16 DIAGNOSIS — E1169 Type 2 diabetes mellitus with other specified complication: Secondary | ICD-10-CM | POA: Diagnosis not present

## 2020-01-16 DIAGNOSIS — E785 Hyperlipidemia, unspecified: Secondary | ICD-10-CM

## 2020-01-16 LAB — POCT GLYCOSYLATED HEMOGLOBIN (HGB A1C): Hemoglobin A1C: 6.2 % — AB (ref 4.0–5.6)

## 2020-01-16 LAB — GLUCOSE, POCT (MANUAL RESULT ENTRY): POC Glucose: 180 mg/dl — AB (ref 70–99)

## 2020-01-16 MED ORDER — INSULIN NPH (HUMAN) (ISOPHANE) 100 UNIT/ML ~~LOC~~ SUSP: SUBCUTANEOUS | Status: DC

## 2020-01-16 NOTE — Patient Instructions (Signed)
I would decrease insulin to 40 units in the morning and 25 units at night. With this change you should be able to resume taking Metformin twice per day.

## 2020-01-16 NOTE — Progress Notes (Signed)
Subjective:    Berthel Bagnall - 64 y.o. male MRN [id number]  Date of birth: 10-12-1955  HPI  Manuel Mayotte is here for follow up of chronic medical conditions.  Diabetes mellitus, Type 2 Disease Monitoring             Blood Sugar Ranges: Fasting -112-125              Polyuria: no             Visual problems: no   Urine Microalbumin <6 in Oct 2020 and on Arb therapy   Last A1C: 7.1 (Oct 2020)   Medications: Insulin NPH 45 in AM and 35 in PM, Metformin 1000 mg BID  Medication Compliance: no, mom stopped Metformin due to CBGs in the 80s    Medication Side Effects             Hypoglycemia: no   Chronic HTN Disease Monitoring:  Home BP Monitoring - Does not monitor.  Chest pain- no  Dyspnea- no Headache - no  Medications: Amlodipine 10 mg, Coreg 6.25 mg BID, Lasix 20 mg, Irbesartan 300 mg, Spironolactone 50 mg  Compliance- yes Lightheadedness- no  Edema- no   Health Maintenance:  Health Maintenance Due  Topic Date Due  . COVID-19 Vaccine (1) Never done  . HEMOGLOBIN A1C  12/05/2019    -  reports that he has never smoked. He has never used smokeless tobacco. - Review of Systems: Per HPI. - Past Medical History: Patient Active Problem List   Diagnosis Date Noted  . Hyperlipidemia 10/10/2019  . Diabetes mellitus ([place]) 10/05/2018  . Left ventricular hypertrophy 10/05/2018  . Essential hypertension 10/05/2018   - Medications: reviewed and updated   Objective:   Physical Exam BP 125/71   Pulse (!) 55   Temp (!) 97.5 F (36.4 C) (Temporal)   Resp 17   Ht 5\' 3"  (1.6 m)   Wt 186 lb (84.4 kg)   SpO2 96%   BMI 32.95 kg/m  Physical Exam  Constitutional: He is oriented to person, place, and time and well-developed, well-nourished, and in no distress. No distress.  HENT:  Head: Normocephalic and atraumatic.  Eyes: Conjunctivae and EOM are normal.  Cardiovascular: Normal rate, regular rhythm and normal heart sounds.  No murmur  heard. Pulmonary/Chest: Effort normal and breath sounds normal. No respiratory distress.  Musculoskeletal:        General: Normal range of motion.  Neurological: He is alert and oriented to person, place, and time.  Skin: Skin is warm and dry. He is not diaphoretic.  Psychiatric: Affect and judgment normal.           Assessment & Plan:   1. Type 2 diabetes mellitus with other specified complication, with long-term current use of insulin (HCC) CBG 180 (non-fasting) and A1c 6.2%. Have recommended decreasing Insulin to 40u AM and 25u PM given reports of hypoglycemia at nighttime and well controlled A1c. Resume Metformin BID with this change.  Counseled on Diabetic diet, my plate method, 270 minutes of moderate intensity exercise/week Blood sugar logs with fasting goals of 80-120 mg/dl, random of less than 180 and in the event of sugars less than 60 mg/dl or greater than 400 mg/dl encouraged to notify the clinic. Advised on the need for annual eye exams, annual foot exams, Pneumonia vaccine. - Glucose (CBG) - HgB A1c  2. Essential hypertension BP at goal. Continue current medication regimen.   3. Hyperlipidemia LDL goal <70 LDL 58 in Oct 2020. Continue  Pravastatin and ASA.      Marcy Siren, D.O. 01/16/2020, 9:45 AM Primary Care at [facility]

## 2020-02-06 ENCOUNTER — Ambulatory Visit: Payer: Medicare Other | Attending: Internal Medicine

## 2020-02-06 DIAGNOSIS — Z23 Encounter for immunization: Secondary | ICD-10-CM

## 2020-02-06 NOTE — Progress Notes (Signed)
   Covid-19 Vaccination Clinic  Name:  Dalton Williams    MRN: [id number] DOB: 01-Jun-1956  02/06/2020  [name] was observed post Covid-19 immunization for 15 minutes without incident. He was provided with Vaccine Information Sheet and instruction to access the V-Safe system.   [name] was instructed to call 911 with any severe reactions post vaccine: Marland Kitchen Difficulty breathing  . Swelling of face and throat  . A fast heartbeat  . A bad rash all over body  . Dizziness and weakness   Immunizations Administered    Name Date Dose VIS Date Route   Pfizer COVID-19 Vaccine 02/06/2020 10:51 AM 0.3 mL 10/19/2018 Intramuscular   Manufacturer: [employer], [employer]   Lot: [id number]   NDC: [address]-2

## 2020-02-13 ENCOUNTER — Telehealth: Payer: Self-pay | Admitting: Internal Medicine

## 2020-02-13 DIAGNOSIS — E1169 Type 2 diabetes mellitus with other specified complication: Secondary | ICD-10-CM

## 2020-02-13 DIAGNOSIS — Z794 Long term (current) use of insulin: Secondary | ICD-10-CM

## 2020-02-13 MED ORDER — "INSULIN SYRINGE-NEEDLE U-100 31G X 5/16"" 0.5 ML MISC": 1 refills | Status: DC

## 2020-02-13 NOTE — Telephone Encounter (Signed)
1) Medication(s) Requested (by name):Insulin Syringe-Needle U-100 (BD INSULIN SYRINGE U/F) 31G X 5/16" 0.5 ML MISC [[id number]   2) Pharmacy of Choice [facility]    3) Special Requests:   Approved medications will be sent to the pharmacy, we will reach out if there is an issue.  Requests made after 3pm may not be addressed until the following business day!  If a patient is unsure of the name of the medication(s) please note and ask patient to call back when they are able to provide all info, do not send to responsible party until all information is available!

## 2020-03-12 LAB — HM DIABETES EYE EXAM

## 2020-03-21 ENCOUNTER — Other Ambulatory Visit: Payer: Self-pay

## 2020-03-21 MED ORDER — CARVEDILOL 6.25 MG PO TABS: ORAL_TABLET | ORAL | 0 refills | Status: DC

## 2020-04-13 ENCOUNTER — Other Ambulatory Visit: Payer: Self-pay

## 2020-04-13 MED ORDER — OXYBUTYNIN CHLORIDE 5 MG PO TABS: 5.0000 mg | ORAL_TABLET | Freq: Two times a day (BID) | ORAL | 0 refills | Status: DC

## 2020-04-13 MED ORDER — AMLODIPINE BESYLATE 10 MG PO TABS: 10.0000 mg | ORAL_TABLET | Freq: Every day | ORAL | 0 refills | Status: DC

## 2020-04-13 MED ORDER — PRAVASTATIN SODIUM 80 MG PO TABS: ORAL_TABLET | ORAL | 0 refills | Status: DC

## 2020-04-13 MED ORDER — IRBESARTAN 300 MG PO TABS: 300.0000 mg | ORAL_TABLET | Freq: Every day | ORAL | 0 refills | Status: DC

## 2020-04-17 ENCOUNTER — Ambulatory Visit: Payer: Medicare Other | Admitting: Internal Medicine

## 2020-04-18 ENCOUNTER — Ambulatory Visit: Payer: Medicare Other | Admitting: Internal Medicine

## 2020-04-24 ENCOUNTER — Telehealth (INDEPENDENT_AMBULATORY_CARE_PROVIDER_SITE_OTHER): Payer: Medicare Other | Admitting: Internal Medicine

## 2020-04-25 NOTE — Progress Notes (Signed)
Opened in error. Patient did not answer phone x3 for televisit.   Balbina Depace, D.O. Primary Care at [place]  04/25/2020, 10:15 AM  

## 2020-05-04 ENCOUNTER — Other Ambulatory Visit: Payer: Self-pay

## 2020-05-04 MED ORDER — FUROSEMIDE 20 MG PO TABS
ORAL_TABLET | ORAL | 1 refills | Status: DC
Start: 2020-05-04 — End: 2020-10-30

## 2020-05-04 MED ORDER — METFORMIN HCL 500 MG PO TABS: ORAL_TABLET | ORAL | 1 refills | Status: DC

## 2020-06-07 ENCOUNTER — Other Ambulatory Visit: Payer: Self-pay

## 2020-06-07 ENCOUNTER — Telehealth (INDEPENDENT_AMBULATORY_CARE_PROVIDER_SITE_OTHER): Payer: Medicare Other | Admitting: Internal Medicine

## 2020-06-07 ENCOUNTER — Encounter: Payer: Self-pay | Admitting: Internal Medicine

## 2020-06-07 DIAGNOSIS — E785 Hyperlipidemia, unspecified: Secondary | ICD-10-CM

## 2020-06-07 DIAGNOSIS — I10 Essential (primary) hypertension: Secondary | ICD-10-CM | POA: Diagnosis not present

## 2020-06-07 DIAGNOSIS — E1169 Type 2 diabetes mellitus with other specified complication: Secondary | ICD-10-CM

## 2020-06-07 DIAGNOSIS — Z794 Long term (current) use of insulin: Secondary | ICD-10-CM | POA: Diagnosis not present

## 2020-06-07 NOTE — Progress Notes (Signed)
Virtual Visit via Telephone Note  I connected with Dalton Williams, on 06/07/2020 at 10:56 AM by telephone due to the COVID-19 pandemic and verified that I am speaking with the correct person using two identifiers.   Consent: I discussed the limitations, risks, security and privacy concerns of performing an evaluation and management service by telephone and the availability of in person appointments. I also discussed with the patient that there may be a patient responsible charge related to this service. The patient expressed understanding and agreed to proceed.   Location of Patient: Home   Location of Provider: Clinic    Persons participating in Telemedicine visit: Dalton Williams [facility] Dr. Earlene Plater  Patient's mother      History of Present Illness: Patient has a visit to f/u on chronic medical conditions.   Diabetes mellitus, Type 2 Disease Monitoring             Blood Sugar Ranges: Fasting -80-100s              Polyuria: no              Visual problems: no   Urine Microalbumin <6 ---on Arb   Last A1C: 6.2 (May 2021)   Medications: Metformin 1000 mg BID, Insulin NPH 35u BID ---although I had advised to use 40u AM and 25 u PM (from decrease in previous dose of 45 AM and 35 PM)  Compliance: yes  Medication Side Effects             Hypoglycemia: yes, having nighttime lows    Chronic HTN Disease Monitoring:  Home BP Monitoring - Not monitoring  Chest pain- no  Dyspnea- no Headache - no  Medications: Amlodipine 10 mg, Coreg 6.25 mg BID, Lasix 20 mg, Irbesartan 300 mg, Spironolactone 50 mg  Compliance- yes Lightheadedness- no  Edema- no   Past Medical History:  Diagnosis Date   Essential hypertension    Hyperlipidemia associated with type 2 diabetes mellitus (HCC)    Mental retardation    Uncontrolled type 2 diabetes mellitus without complication, with long-term current use of insulin    Allergies  Allergen Reactions    Penicillins Rash    Current Outpatient Medications on File Prior to Visit  Medication Sig Dispense Refill   amLODipine (NORVASC) 10 MG tablet Take 1 tablet (10 mg total) by mouth daily. 90 tablet 0   aspirin 81 MG chewable tablet Chew 81 mg by mouth daily.     carvedilol (COREG) 6.25 MG tablet TAKE 1 TABLET(6.25 MG) BY MOUTH TWICE DAILY WITH A MEAL 180 tablet 0   furosemide (LASIX) 20 MG tablet TAKE 1 TABLET(20 MG) BY MOUTH DAILY 90 tablet 1   insulin NPH Human (NOVOLIN N) 100 UNIT/ML injection 40 units qam, 25 units qpm 10 mL    Insulin Syringe-Needle U-100 (BD INSULIN SYRINGE U/F) 31G X 5/16" 0.5 ML MISC USE AS DIRECTED FOR INSULIN INJECTIONS TWICE DAILY 100 each 1   irbesartan (AVAPRO) 300 MG tablet Take 1 tablet (300 mg total) by mouth daily. 90 tablet 0   metFORMIN (GLUCOPHAGE) 500 MG tablet TAKE 2 TABLETS(1000 MG) BY MOUTH TWICE DAILY WITH MEALS 360 tablet 1   Multiple Vitamin (MULTIVITAMIN) tablet Take 1 tablet by mouth daily.     oxybutynin (DITROPAN) 5 MG tablet Take 1 tablet (5 mg total) by mouth 2 (two) times daily. 180 tablet 0   pravastatin (PRAVACHOL) 80 MG tablet TAKE 1 TABLET(80 MG) BY MOUTH DAILY 90 tablet 0   spironolactone (  ALDACTONE) 50 MG tablet Take 1 tablet (50 mg total) by mouth daily. 90 tablet 1   No current facility-administered medications on file prior to visit.    Observations/Objective: NAD. Speaking clearly.  Work of breathing normal.  Alert and oriented. Mood appropriate.   Assessment and Plan: 1. Type 2 diabetes mellitus with other specified complication, with long-term current use of insulin (HCC) Fasting CBGs well controlled. Monitor A1c. I have recommended decreasing nighttime insulin dose to 25u due to nighttime episodes of hypoglycemia.  Counseled on Diabetic diet, my plate method, 616 minutes of moderate intensity exercise/week Blood sugar logs with fasting goals of 80-120 mg/dl, random of less than 073 and in the event of sugars less  than 60 mg/dl or greater than 710 mg/dl encouraged to notify the clinic. Advised on the need for annual eye exams, annual foot exams, Pneumonia vaccine. - Hemoglobin A1c; Future - Microalbumin/Creatinine Ratio, Urine; Future - Comprehensive metabolic panel; Future - Lipid Panel; Future  2. Essential hypertension Not monitoring. Asymptomatic. Continue current regimen. BP trend fairly well controlled at prior office visits.  - CBC with Differential; Future - Comprehensive metabolic panel; Future - Lipid Panel; Future  3. Hyperlipidemia LDL goal <70 Last LDL 58 Oct 2020. On pravastatin.  - Lipid Panel; Future   Follow Up Instructions: Lab visit 10/15; 3 month f/u    I discussed the assessment and treatment plan with the patient. The patient was provided an opportunity to ask questions and all were answered. The patient agreed with the plan and demonstrated an understanding of the instructions.   The patient was advised to call back or seek an in-person evaluation if the symptoms worsen or if the condition fails to improve as anticipated.     I provided 20 minutes total of non-face-to-face time during this encounter including median intraservice time, reviewing previous notes, investigations, ordering medications, medical decision making, coordinating care and patient verbalized understanding at the end of the visit.    Marcy Siren, D.O. Primary Care at [facility]  06/07/2020, 10:56 AM

## 2020-06-08 ENCOUNTER — Other Ambulatory Visit (INDEPENDENT_AMBULATORY_CARE_PROVIDER_SITE_OTHER): Payer: Medicare Other

## 2020-06-08 DIAGNOSIS — Z794 Long term (current) use of insulin: Secondary | ICD-10-CM

## 2020-06-08 DIAGNOSIS — Z23 Encounter for immunization: Secondary | ICD-10-CM | POA: Diagnosis not present

## 2020-06-08 DIAGNOSIS — I10 Essential (primary) hypertension: Secondary | ICD-10-CM

## 2020-06-08 DIAGNOSIS — E785 Hyperlipidemia, unspecified: Secondary | ICD-10-CM

## 2020-06-08 DIAGNOSIS — E1169 Type 2 diabetes mellitus with other specified complication: Secondary | ICD-10-CM

## 2020-06-09 LAB — COMPREHENSIVE METABOLIC PANEL
ALT: 8 IU/L (ref 0–44)
AST: 15 IU/L (ref 0–40)
Albumin/Globulin Ratio: 1.5 (ref 1.2–2.2)
Albumin: 4.3 g/dL (ref 3.8–4.8)
Alkaline Phosphatase: 95 IU/L (ref 44–121)
BUN/Creatinine Ratio: 14 (ref 10–24)
BUN: 24 mg/dL (ref 8–27)
Bilirubin Total: 0.5 mg/dL (ref 0.0–1.2)
CO2: 24 mmol/L (ref 20–29)
Calcium: 9.7 mg/dL (ref 8.6–10.2)
Chloride: 101 mmol/L (ref 96–106)
Creatinine, Ser: 1.66 mg/dL — ABNORMAL HIGH (ref 0.76–1.27)
GFR calc Af Amer: 50 mL/min/{1.73_m2} — ABNORMAL LOW (ref >59)
GFR calc non Af Amer: 44 mL/min/{1.73_m2} — ABNORMAL LOW (ref >59)
Globulin, Total: 2.9 g/dL (ref 1.5–4.5)
Glucose: 153 mg/dL — ABNORMAL HIGH (ref 65–99)
Potassium: 5 mmol/L (ref 3.5–5.2)
Sodium: 138 mmol/L (ref 134–144)
Total Protein: 7.2 g/dL (ref 6.0–8.5)

## 2020-06-09 LAB — CBC WITH DIFFERENTIAL/PLATELET
Basophils Absolute: 0 10*3/uL (ref 0.0–0.2)
Basos: 1 %
EOS (ABSOLUTE): 0.1 10*3/uL (ref 0.0–0.4)
Eos: 1 %
Hematocrit: 37.6 % (ref 37.5–51.0)
Hemoglobin: 11.4 g/dL — ABNORMAL LOW (ref 13.0–17.7)
Immature Grans (Abs): 0 10*3/uL (ref 0.0–0.1)
Immature Granulocytes: 0 %
Lymphocytes Absolute: 1.3 10*3/uL (ref 0.7–3.1)
Lymphs: 24 %
MCH: 21.3 pg — ABNORMAL LOW (ref 26.6–33.0)
MCHC: 30.3 g/dL — ABNORMAL LOW (ref 31.5–35.7)
MCV: 70 fL — ABNORMAL LOW (ref 79–97)
Monocytes Absolute: 0.4 10*3/uL (ref 0.1–0.9)
Monocytes: 7 %
Neutrophils Absolute: 3.9 10*3/uL (ref 1.4–7.0)
Neutrophils: 67 %
Platelets: 361 10*3/uL (ref 150–450)
RBC: 5.35 x10E6/uL (ref 4.14–5.80)
RDW: 14.8 % (ref 11.6–15.4)
WBC: 5.7 10*3/uL (ref 3.4–10.8)

## 2020-06-09 LAB — LIPID PANEL
Chol/HDL Ratio: 2.2 ratio (ref 0.0–5.0)
Cholesterol, Total: 114 mg/dL (ref 100–199)
HDL: 53 mg/dL (ref >39)
LDL Chol Calc (NIH): 48 mg/dL (ref 0–99)
Triglycerides: 55 mg/dL (ref 0–149)
VLDL Cholesterol Cal: 13 mg/dL (ref 5–40)

## 2020-06-09 LAB — MICROALBUMIN / CREATININE URINE RATIO
Creatinine, Urine: 111.5 mg/dL
Microalb/Creat Ratio: 16 mg/g creat (ref 0–29)
Microalbumin, Urine: 18.3 ug/mL

## 2020-06-09 LAB — HEMOGLOBIN A1C
Est. average glucose Bld gHb Est-mCnc: 166 mg/dL
Hgb A1c MFr Bld: 7.4 % — ABNORMAL HIGH (ref 4.8–5.6)

## 2020-06-15 ENCOUNTER — Other Ambulatory Visit: Payer: Self-pay | Admitting: Internal Medicine

## 2020-06-15 DIAGNOSIS — R7989 Other specified abnormal findings of blood chemistry: Secondary | ICD-10-CM

## 2020-06-15 DIAGNOSIS — D649 Anemia, unspecified: Secondary | ICD-10-CM | POA: Insufficient documentation

## 2020-06-15 MED ORDER — FERROUS SULFATE 324 (65 FE) MG PO TBEC: 1.0000 | DELAYED_RELEASE_TABLET | ORAL | 0 refills | Status: AC

## 2020-06-15 NOTE — Progress Notes (Signed)
Patient notified of results & recommendations. Expressed understanding.  Made a lab appointment on 06/18/2020 @ 2 PM.

## 2020-06-15 NOTE — Progress Notes (Addendum)
Patient notified of results & recommendations. Expressed understanding.  Made a lab appointment for 06/18/2020 @ 2 PM to repeat his kidney function.

## 2020-06-18 ENCOUNTER — Other Ambulatory Visit: Payer: Self-pay

## 2020-06-18 ENCOUNTER — Other Ambulatory Visit: Payer: Medicare Other

## 2020-06-18 DIAGNOSIS — R7989 Other specified abnormal findings of blood chemistry: Secondary | ICD-10-CM

## 2020-06-19 ENCOUNTER — Telehealth: Payer: Self-pay

## 2020-06-19 LAB — BASIC METABOLIC PANEL
BUN/Creatinine Ratio: 14 (ref 10–24)
BUN: 27 mg/dL (ref 8–27)
CO2: 24 mmol/L (ref 20–29)
Calcium: 10.3 mg/dL — ABNORMAL HIGH (ref 8.6–10.2)
Chloride: 99 mmol/L (ref 96–106)
Creatinine, Ser: 1.91 mg/dL — ABNORMAL HIGH (ref 0.76–1.27)
GFR calc Af Amer: 42 mL/min/{1.73_m2} — ABNORMAL LOW (ref >59)
GFR calc non Af Amer: 37 mL/min/{1.73_m2} — ABNORMAL LOW (ref >59)
Glucose: 35 mg/dL — CL (ref 65–99)
Potassium: 4.4 mmol/L (ref 3.5–5.2)
Sodium: 137 mmol/L (ref 134–144)

## 2020-06-19 NOTE — Telephone Encounter (Signed)
Can you please address this since you are covering for Dr. Earlene Plater who is out?  Looks like this was a lab only visit.  Thank you.

## 2020-06-19 NOTE — Telephone Encounter (Signed)
Call received from Candace/[facility] with critical lab - Glucose 35 from 06/18/2020.

## 2020-06-20 NOTE — Telephone Encounter (Signed)
Please attempt to contact patient. I called him yesterday and message went to voicemail. I have also forwarded you his lab results so please ignore this message if you have already been able to contact the patient

## 2020-06-22 ENCOUNTER — Other Ambulatory Visit: Payer: Self-pay

## 2020-06-22 MED ORDER — CARVEDILOL 6.25 MG PO TABS
ORAL_TABLET | ORAL | 0 refills | Status: DC
Start: 2020-06-22 — End: 2020-09-18

## 2020-06-27 NOTE — Progress Notes (Signed)
Patient's mom notified of results & recommendations. Expressed understanding.  Does patient need to continue holding the Irbesartan?

## 2020-07-05 ENCOUNTER — Other Ambulatory Visit: Payer: Self-pay | Admitting: Cardiology

## 2020-07-10 ENCOUNTER — Other Ambulatory Visit: Payer: Self-pay

## 2020-07-10 MED ORDER — PRAVASTATIN SODIUM 80 MG PO TABS
ORAL_TABLET | ORAL | 0 refills | Status: DC
Start: 2020-07-10 — End: 2020-10-16

## 2020-07-10 MED ORDER — OXYBUTYNIN CHLORIDE 5 MG PO TABS
5.0000 mg | ORAL_TABLET | Freq: Two times a day (BID) | ORAL | 0 refills | Status: DC
Start: 2020-07-10 — End: 2020-11-30

## 2020-07-10 MED ORDER — AMLODIPINE BESYLATE 10 MG PO TABS: 10.0000 mg | ORAL_TABLET | Freq: Every day | ORAL | 0 refills | Status: DC

## 2020-07-10 MED ORDER — IRBESARTAN 300 MG PO TABS
300.0000 mg | ORAL_TABLET | Freq: Every day | ORAL | 0 refills | Status: DC
Start: 2020-07-10 — End: 2020-10-16

## 2020-09-05 ENCOUNTER — Other Ambulatory Visit: Payer: Self-pay | Admitting: Internal Medicine

## 2020-09-05 ENCOUNTER — Telehealth: Payer: Self-pay | Admitting: Internal Medicine

## 2020-09-05 DIAGNOSIS — E1169 Type 2 diabetes mellitus with other specified complication: Secondary | ICD-10-CM

## 2020-09-05 MED ORDER — "INSULIN SYRINGE-NEEDLE U-100 31G X 5/16"" 0.5 ML MISC": 1 refills | Status: DC

## 2020-09-05 NOTE — Telephone Encounter (Signed)
1) Medication(s) Requested (by name): Insulin Syringe-Needle U-100 (BD INSULIN SYRINGE U/F) 31G X 5/16" 0.5 ML MISC   2) Pharmacy of Choice:[id number] - [place], [place] - [address] AT [facility] OF [address]   3) Special Requests:   Approved medications will be sent to the pharmacy, we will reach out if there is an issue.  Requests made after 3pm may not be addressed until the following business day!  If a patient is unsure of the name of the medication(s) please note and ask patient to call back when they are able to provide all info, do not send to responsible party until all information is available!

## 2020-09-05 NOTE — Telephone Encounter (Signed)
Refill sent.   Marcy Siren, D.O. Primary Care at [facility]  09/05/2020, 2:31 PM

## 2020-09-17 ENCOUNTER — Other Ambulatory Visit: Payer: Self-pay | Admitting: Internal Medicine

## 2020-10-04 ENCOUNTER — Other Ambulatory Visit: Payer: Self-pay | Admitting: Cardiology

## 2020-10-06 ENCOUNTER — Other Ambulatory Visit: Payer: Self-pay | Admitting: Internal Medicine

## 2020-10-18 ENCOUNTER — Other Ambulatory Visit: Payer: Self-pay

## 2020-10-30 ENCOUNTER — Other Ambulatory Visit: Payer: Self-pay | Admitting: Internal Medicine

## 2020-11-28 ENCOUNTER — Telehealth: Payer: Self-pay | Admitting: Internal Medicine

## 2020-11-28 NOTE — Telephone Encounter (Signed)
OXYBUTYNIN 5MG  1 tablet twice daily. LAST: prescribed on 07/10/2020 NO REFILLS Remain PHARMACY:  [facility] [address] Pt phone [phone]

## 2020-11-30 ENCOUNTER — Other Ambulatory Visit: Payer: Self-pay | Admitting: *Deleted

## 2020-11-30 MED ORDER — OXYBUTYNIN CHLORIDE 5 MG PO TABS: 5.0000 mg | ORAL_TABLET | Freq: Two times a day (BID) | ORAL | 0 refills | Status: DC

## 2020-12-14 ENCOUNTER — Other Ambulatory Visit: Payer: Self-pay | Admitting: Internal Medicine

## 2020-12-16 NOTE — Telephone Encounter (Signed)
Carvedilol refilled per patient request. Courtesy refill for 30 day supply. Please schedule office visit for additional refills.

## 2021-01-09 ENCOUNTER — Other Ambulatory Visit: Payer: Self-pay | Admitting: Internal Medicine

## 2021-01-10 ENCOUNTER — Telehealth: Payer: Self-pay | Admitting: Internal Medicine

## 2021-01-10 NOTE — Telephone Encounter (Signed)
Pt's mother came in asking for refills on pt's medication  oxybutynin (DITROPAN) 5 MG tablet   Pharmacy  [id number] [place], [place] - [phone] AT [facility] OF [facility] & [address]  [address] [place], [place] [address]  Phone:  [phone] Fax:  [phone]  DEA #:  [id number]

## 2021-01-10 NOTE — Telephone Encounter (Signed)
Will need an OV prior to additional refills.

## 2021-01-14 ENCOUNTER — Other Ambulatory Visit: Payer: Self-pay | Admitting: Internal Medicine

## 2021-01-14 ENCOUNTER — Other Ambulatory Visit: Payer: Self-pay | Admitting: Family

## 2021-01-23 ENCOUNTER — Encounter: Payer: Self-pay | Admitting: Internal Medicine

## 2021-01-23 ENCOUNTER — Other Ambulatory Visit: Payer: Self-pay

## 2021-01-23 ENCOUNTER — Ambulatory Visit (INDEPENDENT_AMBULATORY_CARE_PROVIDER_SITE_OTHER): Payer: Medicare Other | Admitting: Internal Medicine

## 2021-01-23 VITALS — BP 159/90 | HR 62 | Temp 97.9°F | Resp 16 | Wt 173.0 lb

## 2021-01-23 DIAGNOSIS — N3281 Overactive bladder: Secondary | ICD-10-CM

## 2021-01-23 DIAGNOSIS — Z794 Long term (current) use of insulin: Secondary | ICD-10-CM | POA: Diagnosis not present

## 2021-01-23 DIAGNOSIS — E1169 Type 2 diabetes mellitus with other specified complication: Secondary | ICD-10-CM | POA: Diagnosis not present

## 2021-01-23 LAB — POCT GLYCOSYLATED HEMOGLOBIN (HGB A1C): Hemoglobin A1C: 7.5 % — AB (ref 4.0–5.6)

## 2021-01-23 LAB — GLUCOSE, POCT (MANUAL RESULT ENTRY): POC Glucose: 206 mg/dl — AB (ref 70–99)

## 2021-01-23 MED ORDER — OXYBUTYNIN CHLORIDE 5 MG PO TABS: 5.0000 mg | ORAL_TABLET | Freq: Two times a day (BID) | ORAL | 0 refills | Status: AC

## 2021-01-23 MED ORDER — "INSULIN SYRINGE-NEEDLE U-100 31G X 5/16"" 0.5 ML MISC": 1 refills | Status: DC

## 2021-01-23 NOTE — Progress Notes (Signed)
Special needs- Concerns per mother are decreased appetite.  Refill on needles and oxybutinin  Pt inquiring about "pee pill"  If she should begin again.

## 2021-01-23 NOTE — Progress Notes (Signed)
  Subjective:    Dalton Williams - 65 y.o. male MRN [id number]  Date of birth: 12-18-1955  HPI  Dalton Williams is here for follow up.  Diabetes mellitus, Type 2 Disease Monitoring             Blood Sugar Ranges: Fasting - <120s             Polyuria: no             Visual problems: no   Urine Microalbumin 16 (Oct 2021) --on Arb   Last A1C: 7.4 (Oct 2021)   Medications: Metformin 1000 mg BID, Insulin NPH 40 AM and 25u PM  Medication Compliance: yes  Medication Side Effects             Hypoglycemia: no       Health Maintenance:  Health Maintenance Due  Topic Date Due  . Pneumococcal Vaccine 60-92 Years old (1 of 4 - PCV13) Never done  . Zoster Vaccines- Shingrix (1 of 2) Never done  . FOOT EXAM  06/05/2020  . COVID-19 Vaccine (3 - Booster for Pfizer series) 07/08/2020    -  reports that he has never smoked. He has never used smokeless tobacco. - Review of Systems: Per HPI. - Past Medical History: Patient Active Problem List   Diagnosis Date Noted  . Anemia 06/15/2020  . Hyperlipidemia 10/10/2019  . Diabetes mellitus (HCC) 10/05/2018  . Left ventricular hypertrophy 10/05/2018  . Essential hypertension 10/05/2018  . Hyperlipidemia associated with type 2 diabetes mellitus (HCC) 04/09/2017  . Special needs due to learning disability 02/24/2017   - Medications: reviewed and updated   Objective:   Physical Exam BP (!) 159/90 (BP Location: Right Arm, Patient Position: Sitting, Cuff Size: Normal)   Pulse 62   Temp 97.9 F (36.6 C)   Resp 16   Wt 173 lb (78.5 kg)   SpO2 98%   BMI 30.65 kg/m  Physical Exam         Assessment & Plan:   1. Type 2 diabetes mellitus with other specified complication, with long-term current use of insulin (HCC) A1c 7.5%. Fairly good control, close to goal. Continue to work on diet and exercise. Patient has just started walking regimen and is motivated to keep DM under control. No adjustments to medication regimen at present.  -  Glucose (CBG) - HgB A1c - Insulin Syringe-Needle U-100 (BD INSULIN SYRINGE U/F) 31G X 5/16" 0.5 ML MISC; USE AS DIRECTED FOR INSULIN INJECTIONS TWICE DAILY  Dispense: 100 each; Refill: 1  2. Overactive bladder - oxybutynin (DITROPAN) 5 MG tablet; Take 1 tablet (5 mg total) by mouth 2 (two) times daily.  Dispense: 60 tablet; Refill: 0      Marcy Siren, D.O. 01/30/2021, 8:21 AM Primary Care at [facility]

## 2021-04-20 ENCOUNTER — Telehealth: Payer: Self-pay | Admitting: Internal Medicine

## 2021-04-20 NOTE — Telephone Encounter (Signed)
Attempted to contact patient to schedule a MCW. No answer. Left a msg for patient. Unable to send mychart msg as patient does not have mychart. AS, CMA

## 2021-04-25 ENCOUNTER — Other Ambulatory Visit: Payer: Self-pay

## 2021-04-25 NOTE — Progress Notes (Signed)
Patient ID: Dalton Williams, male    DOB: 23-Aug-1956  MRN: [id number]  CC: Diabetes and Hypertension Follow-Up   Subjective: Dalton Williams is a 65 y.o. male who presents for diabetes and hypertension follow-up. Accompanied by mother Dalton Williams.   His concerns today include:  DIABETES TYPE 2 FOLLOW-UP: 01/23/2021 per DO note: A1c 7.5%. Fairly good control, close to goal. Continue to work on diet and exercise. Patient has just started walking regimen and is motivated to keep DM under control. No adjustments to medication regimen at present.   04/26/2021: Taking Metformin only as needed. Taking insulin 20 units in the morning and 20 units in the evening. Blood sugars 70's to 80's.   2. HYPERTENSION FOLLOW-UP: 06/07/2020 per DO note: Not monitoring. Asymptomatic. Continue current regimen. BP trend fairly well controlled at prior office visits.   04/26/2021: Currently taking: see medication list Med Adherence: Ran out of blood pressure medications 3 weeks ago. Only medications he was taking was Spironolactone and Furosemide.  Medication side effects: []  Yes    [x]  No Adherence with salt restriction (low-salt diet): []  Yes  [x]  No Exercise: Yes [x]  No []  Home Monitoring?: []  Yes    [x]  No Monitoring Frequency: []  Yes    [x]  No Home BP results range: []  Yes    [x]  No SOB? []  Yes    [x]  No Chest Pain?: []  Yes    [x]  No  Patient Active Problem List   Diagnosis Date Noted   Anemia 06/15/2020   Hyperlipidemia 10/10/2019   Diabetes mellitus (HCC) 10/05/2018   Left ventricular hypertrophy 10/05/2018   Essential hypertension 10/05/2018   Hyperlipidemia associated with type 2 diabetes mellitus (HCC) 04/09/2017   Special needs due to learning disability 02/24/2017     Current Outpatient Medications on File Prior to Visit  Medication Sig Dispense Refill   aspirin 81 MG chewable tablet Chew 81 mg by mouth daily.     ferrous sulfate 324 (65 Fe) MG TBEC Take 1 tablet (325 mg total) by mouth  every other day. 90 tablet 0   Multiple Vitamin (MULTIVITAMIN) tablet Take 1 tablet by mouth daily.     oxybutynin (DITROPAN) 5 MG tablet Take 1 tablet (5 mg total) by mouth 2 (two) times daily. 60 tablet 0   No current facility-administered medications on file prior to visit.    Allergies  Allergen Reactions   Penicillins Rash    Social History   Socioeconomic History   Marital status: Single    Spouse name: Not on file   Number of children: Not on file   Years of education: Not on file   Highest education level: Not on file  Occupational History   Not on file  Tobacco Use   Smoking status: Never   Smokeless tobacco: Never  Vaping Use   Vaping Use: Never used  Substance and Sexual Activity   Alcohol use: No   Drug use: No   Sexual activity: Not on file  Other Topics Concern   Not on file  Social History Narrative   Not on file   Social Determinants of Health   Financial Resource Strain: Not on file  Food Insecurity: Not on file  Transportation Needs: Not on file  Physical Activity: Not on file  Stress: Not on file  Social Connections: Not on file  Intimate Partner Violence: Not on file    Family History  Problem Relation Age of Onset   Diabetes Mother    Kidney failure Father  Diabetes Sister    Stroke Brother    Diabetes Brother    Chronic Renal Failure Brother     Past Surgical History:  Procedure Laterality Date   NO PAST SURGERIES      ROS: Review of Systems Negative except as stated above  PHYSICAL EXAM: BP (!) 193/90 (BP Location: Left Arm, Patient Position: Sitting, Cuff Size: Large)   Pulse 73   Temp 97.9 F (36.6 C)   Resp 16   Ht 5' 3.78" (1.62 m)   Wt 180 lb 6.4 oz (81.8 kg)   SpO2 99%   BMI 31.18 kg/m   Physical Exam HENT:     Head: Normocephalic and atraumatic.  Eyes:     Extraocular Movements: Extraocular movements intact.     Conjunctiva/sclera: Conjunctivae normal.     Pupils: Pupils are equal, round, and reactive  to light.  Cardiovascular:     Rate and Rhythm: Normal rate and regular rhythm.     Pulses: Normal pulses.     Heart sounds: Normal heart sounds.  Pulmonary:     Effort: Pulmonary effort is normal.     Breath sounds: Normal breath sounds.  Musculoskeletal:     Cervical back: Normal range of motion and neck supple.  Neurological:     General: No focal deficit present.     Mental Status: He is alert and oriented to person, place, and time.  Psychiatric:        Mood and Affect: Mood normal.        Behavior: Behavior normal.    ASSESSMENT AND PLAN: 1. Type 2 diabetes mellitus with other specified complication, with long-term current use of insulin (HCC): - Continue Metformin and Insulin NPH Human as prescribed.  - Discussed the importance of healthy eating habits, low-carbohydrate diet, low-sugar diet, regular aerobic exercise (at least 150 minutes a week as tolerated) and medication compliance to achieve or maintain control of diabetes. - Hemoglobin A1c today to check level of diabetes control.  - Follow-up with primary provider as scheduled.  - Hemoglobin A1c - metFORMIN (GLUCOPHAGE) 500 MG tablet; TAKE 2 TABLETS(1000 MG) BY MOUTH TWICE DAILY WITH MEALS.  Dispense: 360 tablet; Refill: 0 - insulin NPH Human (NOVOLIN N) 100 UNIT/ML injection; 40 units qam, 25 units qpm  Dispense: 10 mL - Insulin Syringe-Needle U-100 (BD INSULIN SYRINGE U/F) 31G X 5/16" 0.5 ML MISC; USE AS DIRECTED FOR INSULIN INJECTIONS TWICE DAILY  Dispense: 100 each; Refill: 1  2. Essential hypertension: - Blood pressure not at goal during today's visit. Patient asymptomatic without chest pressure, chest pain, palpitations, shortness of breath, and worst headache of life. - Patient without blood pressure medications for at least 3 weeks. - Clonidine administered today in clinic.  - Continue Amlodipine, Carvedilol, Furosemide, Irbesartan, and Spironolactone as prescribed.  - Counseled on blood pressure goal of less  than 130/80, low-sodium, DASH diet, medication compliance, 150 minutes of moderate intensity exercise per week as tolerated. Discussed medication compliance, adverse effects. - Follow-up with primary provider in 1 week or sooner if needed for blood pressure check. - cloNIDine (CATAPRES) tablet 0.2 mg - amLODipine (NORVASC) 10 MG tablet; TAKE 1 TABLET(10 MG) BY MOUTH DAILY  Dispense: 90 tablet; Refill: 0 - carvedilol (COREG) 6.25 MG tablet; TAKE 1 TABLET(6.25 MG) BY MOUTH TWICE DAILY WITH A MEAL.  Dispense: 180 tablet; Refill: 0 - furosemide (LASIX) 20 MG tablet; TAKE 1 TABLET(20 MG) BY MOUTH DAILY.  Dispense: 90 tablet; Refill: 0 - irbesartan (AVAPRO) 300 MG tablet;  TAKE 1 TABLET(300 MG) BY MOUTH DAILY  Dispense: 90 tablet; Refill: 0 - spironolactone (ALDACTONE) 50 MG tablet; TAKE 1 TABLET(50 MG) BY MOUTH DAILY  Dispense: 90 tablet; Refill: 0  3. Hyperlipidemia, unspecified hyperlipidemia type: -Practice low-fat heart healthy diet and at least 150 minutes of moderate intensity exercise weekly as tolerated.  - Continue Pravastatin as prescribed.  - Follow-up with primary provider as scheduled.  - pravastatin (PRAVACHOL) 80 MG tablet; TAKE 1 TABLET(80 MG) BY MOUTH DAILY.  Dispense: 90 tablet; Refill: 0    Patient was given the opportunity to ask questions.  Patient verbalized understanding of the plan and was able to repeat key elements of the plan. Patient was given clear instructions to go to Emergency Department or return to medical center if symptoms don't improve, worsen, or new problems develop.The patient verbalized understanding.   Orders Placed This Encounter  Procedures   Hemoglobin A1c    Requested Prescriptions   Signed Prescriptions Disp Refills   metFORMIN (GLUCOPHAGE) 500 MG tablet 360 tablet 0    Sig: TAKE 2 TABLETS(1000 MG) BY MOUTH TWICE DAILY WITH MEALS.   amLODipine (NORVASC) 10 MG tablet 90 tablet 0    Sig: TAKE 1 TABLET(10 MG) BY MOUTH DAILY   carvedilol (COREG)  6.25 MG tablet 180 tablet 0    Sig: TAKE 1 TABLET(6.25 MG) BY MOUTH TWICE DAILY WITH A MEAL.   furosemide (LASIX) 20 MG tablet 90 tablet 0    Sig: TAKE 1 TABLET(20 MG) BY MOUTH DAILY.   irbesartan (AVAPRO) 300 MG tablet 90 tablet 0    Sig: TAKE 1 TABLET(300 MG) BY MOUTH DAILY   spironolactone (ALDACTONE) 50 MG tablet 90 tablet 0    Sig: TAKE 1 TABLET(50 MG) BY MOUTH DAILY   insulin NPH Human (NOVOLIN N) 100 UNIT/ML injection 10 mL     Sig: 40 units qam, 25 units qpm   Insulin Syringe-Needle U-100 (BD INSULIN SYRINGE U/F) 31G X 5/16" 0.5 ML MISC 100 each 1    Sig: USE AS DIRECTED FOR INSULIN INJECTIONS TWICE DAILY   pravastatin (PRAVACHOL) 80 MG tablet 90 tablet 0    Sig: TAKE 1 TABLET(80 MG) BY MOUTH DAILY.    Return in about 1 week (around 05/03/2021) for hypertension .  Rema Fendt, NP

## 2021-04-26 ENCOUNTER — Ambulatory Visit (INDEPENDENT_AMBULATORY_CARE_PROVIDER_SITE_OTHER): Payer: Medicare Other | Admitting: Family

## 2021-04-26 ENCOUNTER — Other Ambulatory Visit: Payer: Self-pay

## 2021-04-26 VITALS — BP 193/90 | HR 73 | Temp 97.9°F | Resp 16 | Ht 63.78 in | Wt 180.4 lb

## 2021-04-26 DIAGNOSIS — Z794 Long term (current) use of insulin: Secondary | ICD-10-CM

## 2021-04-26 DIAGNOSIS — I10 Essential (primary) hypertension: Secondary | ICD-10-CM

## 2021-04-26 DIAGNOSIS — E785 Hyperlipidemia, unspecified: Secondary | ICD-10-CM

## 2021-04-26 DIAGNOSIS — E1169 Type 2 diabetes mellitus with other specified complication: Secondary | ICD-10-CM

## 2021-04-26 MED ORDER — METFORMIN HCL 500 MG PO TABS: ORAL_TABLET | ORAL | 0 refills | Status: DC

## 2021-04-26 MED ORDER — CARVEDILOL 6.25 MG PO TABS: ORAL_TABLET | ORAL | 0 refills | Status: DC

## 2021-04-26 MED ORDER — PRAVASTATIN SODIUM 80 MG PO TABS: ORAL_TABLET | ORAL | 0 refills | Status: DC

## 2021-04-26 MED ORDER — AMLODIPINE BESYLATE 10 MG PO TABS: ORAL_TABLET | ORAL | 0 refills | Status: DC

## 2021-04-26 MED ORDER — SPIRONOLACTONE 50 MG PO TABS: ORAL_TABLET | ORAL | 0 refills | Status: DC

## 2021-04-26 MED ORDER — IRBESARTAN 300 MG PO TABS: ORAL_TABLET | ORAL | 0 refills | Status: DC

## 2021-04-26 MED ORDER — CLONIDINE HCL 0.2 MG PO TABS
0.2000 mg | ORAL_TABLET | Freq: Once | ORAL | Status: AC
  Administered 2021-04-26: 0.2 mg via ORAL

## 2021-04-26 MED ORDER — FUROSEMIDE 20 MG PO TABS: ORAL_TABLET | ORAL | 0 refills | Status: DC

## 2021-04-26 MED ORDER — INSULIN SYRINGE-NEEDLE U-100 31G X 5/16" 0.5 ML MISC
1 refills | Status: AC
Start: 2021-04-26 — End: ?

## 2021-04-26 MED ORDER — INSULIN NPH (HUMAN) (ISOPHANE) 100 UNIT/ML ~~LOC~~ SUSP: SUBCUTANEOUS | Status: DC

## 2021-04-26 NOTE — Progress Notes (Signed)
Pt present for diabetes follow-up accompanied by mother [name]

## 2021-04-27 LAB — HEMOGLOBIN A1C
Est. average glucose Bld gHb Est-mCnc: 154 mg/dL
Hgb A1c MFr Bld: 7 % — ABNORMAL HIGH (ref 4.8–5.6)

## 2021-04-27 NOTE — Progress Notes (Signed)
Please call patient with update.   Hemoglobin A1c improved and at goal since 3 months ago. We will continue with the plan discussed in office.

## 2021-05-21 ENCOUNTER — Encounter: Payer: Self-pay | Admitting: Family Medicine

## 2021-05-21 ENCOUNTER — Other Ambulatory Visit: Payer: Self-pay

## 2021-05-21 ENCOUNTER — Ambulatory Visit (INDEPENDENT_AMBULATORY_CARE_PROVIDER_SITE_OTHER): Payer: Medicare Other | Admitting: Family Medicine

## 2021-05-21 VITALS — BP 150/80 | HR 55 | Temp 98.1°F | Resp 16 | Ht 64.0 in | Wt 180.6 lb

## 2021-05-21 DIAGNOSIS — E1169 Type 2 diabetes mellitus with other specified complication: Secondary | ICD-10-CM

## 2021-05-21 DIAGNOSIS — F819 Developmental disorder of scholastic skills, unspecified: Secondary | ICD-10-CM

## 2021-05-21 DIAGNOSIS — E785 Hyperlipidemia, unspecified: Secondary | ICD-10-CM | POA: Diagnosis not present

## 2021-05-21 DIAGNOSIS — I10 Essential (primary) hypertension: Secondary | ICD-10-CM | POA: Diagnosis not present

## 2021-05-21 DIAGNOSIS — Z794 Long term (current) use of insulin: Secondary | ICD-10-CM

## 2021-05-21 NOTE — Progress Notes (Signed)
Patient is new to provider

## 2021-05-22 NOTE — Progress Notes (Signed)
Established Patient Office Visit  Subjective:  Patient ID: Dalton Williams, male    DOB: [birth year]  Age: 65 y.o. MRN: [id number]  CC:  Chief Complaint  Patient presents with   Establish Care   Diabetes    HPI Dalton Williams presents for to follow-up on chronic medical issues including diabetes and hypertension.  Patient denies any acute complaints or concerns on today.  Past Medical History:  Diagnosis Date   Essential hypertension    Hyperlipidemia associated with type 2 diabetes mellitus (HCC)    Mental retardation    Uncontrolled type 2 diabetes mellitus without complication, with long-term current use of insulin      Social History   Socioeconomic History   Marital status: Single    Spouse name: Not on file   Number of children: Not on file   Years of education: Not on file   Highest education level: Not on file  Occupational History   Not on file  Tobacco Use   Smoking status: Never   Smokeless tobacco: Never  Vaping Use   Vaping Use: Never used  Substance and Sexual Activity   Alcohol use: No   Drug use: No   Sexual activity: Not on file  Other Topics Concern   Not on file  Social History Narrative   Not on file   Social Determinants of Health   Financial Resource Strain: Not on file  Food Insecurity: Not on file  Transportation Needs: Not on file  Physical Activity: Not on file  Stress: Not on file  Social Connections: Not on file  Intimate Partner Violence: Not on file    ROS Review of Systems  All other systems reviewed and are negative.  Objective:   Today's Vitals: BP (!) 150/80 (BP Location: Right Arm, Patient Position: Sitting, Cuff Size: Large)   Pulse (!) 55   Temp 98.1 F (36.7 C) (Oral)   Resp 16   Ht 5\' 4"  (1.626 m)   Wt 180 lb 9.6 oz (81.9 kg)   SpO2 98%   BMI 31.00 kg/m   Physical Exam Vitals and nursing note reviewed.  Constitutional:      General: He is not in acute distress. Cardiovascular:     Rate and  Rhythm: Normal rate and regular rhythm.  Pulmonary:     Effort: Pulmonary effort is normal.     Breath sounds: Normal breath sounds.  Abdominal:     Palpations: Abdomen is soft.     Tenderness: There is no abdominal tenderness.  Musculoskeletal:     Right lower leg: No edema.     Left lower leg: No edema.  Neurological:     General: No focal deficit present.     Mental Status: He is alert and oriented to person, place, and time.    Assessment & Plan:   1. Type 2 diabetes mellitus with other specified complication, with long-term current use of insulin (HCC) Recent A1c is right at goal.  Continue present management and monitor.  2. Essential hypertension Slightly elevated reading.  Patient defers changing medications at this time.  Will monitor.  3. Hyperlipidemia, unspecified hyperlipidemia type Continue present management.  4. Special needs due to learning disability    Outpatient Encounter Medications as of 05/21/2021  Medication Sig   amLODipine (NORVASC) 10 MG tablet TAKE 1 TABLET(10 MG) BY MOUTH DAILY   aspirin 81 MG chewable tablet Chew 81 mg by mouth daily.   carvedilol (COREG) 6.25 MG tablet TAKE 1 TABLET(6.25 MG)  BY MOUTH TWICE DAILY WITH A MEAL.   ferrous sulfate 324 (65 Fe) MG TBEC Take 1 tablet (325 mg total) by mouth every other day.   furosemide (LASIX) 20 MG tablet TAKE 1 TABLET(20 MG) BY MOUTH DAILY.   insulin NPH Human (NOVOLIN N) 100 UNIT/ML injection 40 units qam, 25 units qpm   Insulin Syringe-Needle U-100 (BD INSULIN SYRINGE U/F) 31G X 5/16" 0.5 ML MISC USE AS DIRECTED FOR INSULIN INJECTIONS TWICE DAILY   irbesartan (AVAPRO) 300 MG tablet TAKE 1 TABLET(300 MG) BY MOUTH DAILY   metFORMIN (GLUCOPHAGE) 500 MG tablet TAKE 2 TABLETS(1000 MG) BY MOUTH TWICE DAILY WITH MEALS.   Multiple Vitamin (MULTIVITAMIN) tablet Take 1 tablet by mouth daily.   oxybutynin (DITROPAN) 5 MG tablet Take 1 tablet (5 mg total) by mouth 2 (two) times daily.   pravastatin  (PRAVACHOL) 80 MG tablet TAKE 1 TABLET(80 MG) BY MOUTH DAILY.   spironolactone (ALDACTONE) 50 MG tablet TAKE 1 TABLET(50 MG) BY MOUTH DAILY   No facility-administered encounter medications on file as of 05/21/2021.    Follow-up: Return in about 5 months (around 10/21/2021) for follow up.   [name], MD

## 2021-07-21 ENCOUNTER — Other Ambulatory Visit: Payer: Self-pay | Admitting: Family

## 2021-07-21 DIAGNOSIS — E785 Hyperlipidemia, unspecified: Secondary | ICD-10-CM

## 2021-07-21 DIAGNOSIS — I10 Essential (primary) hypertension: Secondary | ICD-10-CM

## 2021-07-21 DIAGNOSIS — Z794 Long term (current) use of insulin: Secondary | ICD-10-CM

## 2021-07-22 ENCOUNTER — Other Ambulatory Visit: Payer: Self-pay | Admitting: Family

## 2021-07-22 DIAGNOSIS — I10 Essential (primary) hypertension: Secondary | ICD-10-CM

## 2021-09-21 IMAGING — DX DG ABDOMEN 1V
2 series · 2 of 2 positions shown · non-contrast
Comparison: CT abdomen and pelvis 07/06/2016.

CLINICAL DATA: 63-year-old who presents with absence of bowel
movements over the past 1 week associated with generalized abdominal
pain.

EXAM:
ABDOMEN - 1 VIEW

[abdomen supine ap (1 of 2)]
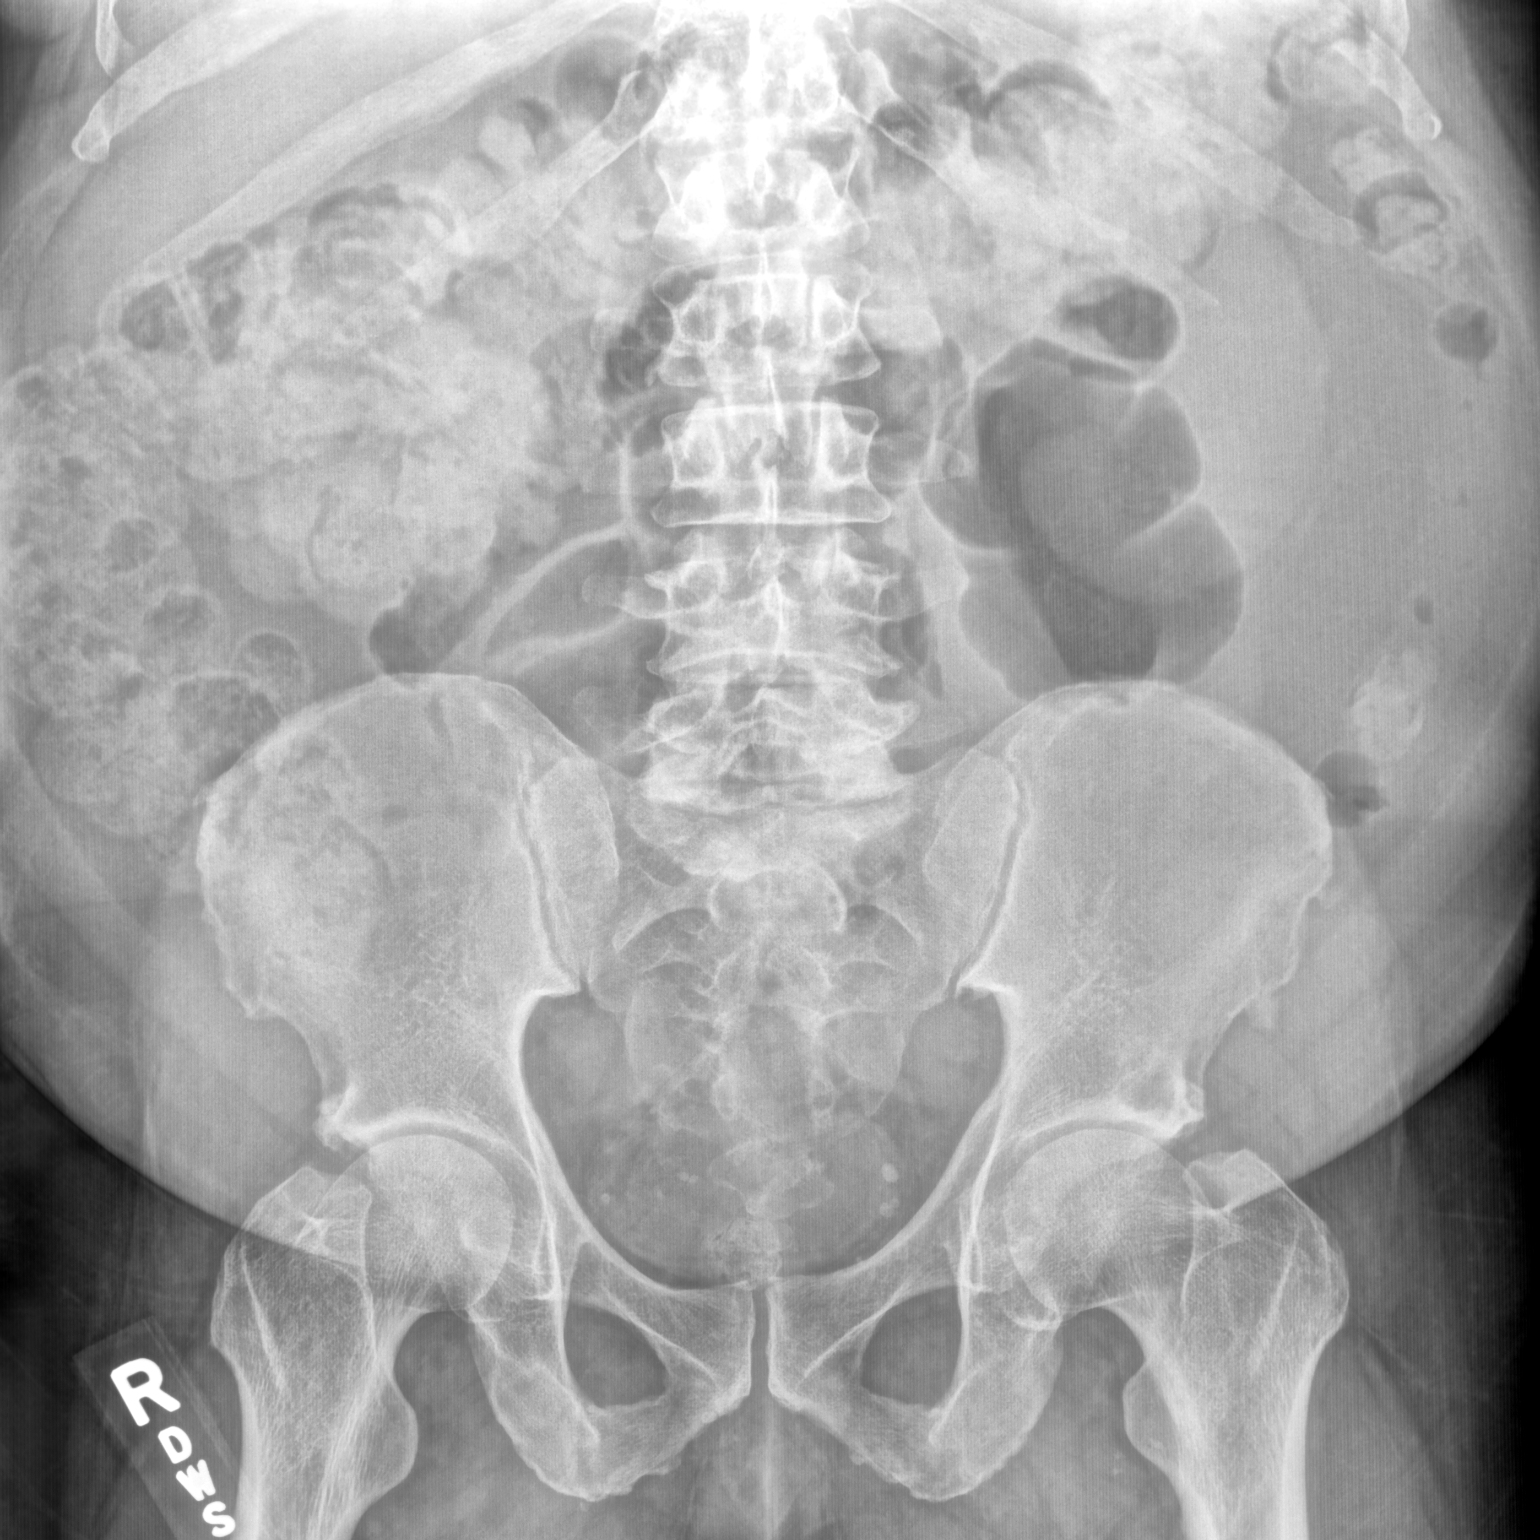

[abdomen supine ap (2 of 2)]
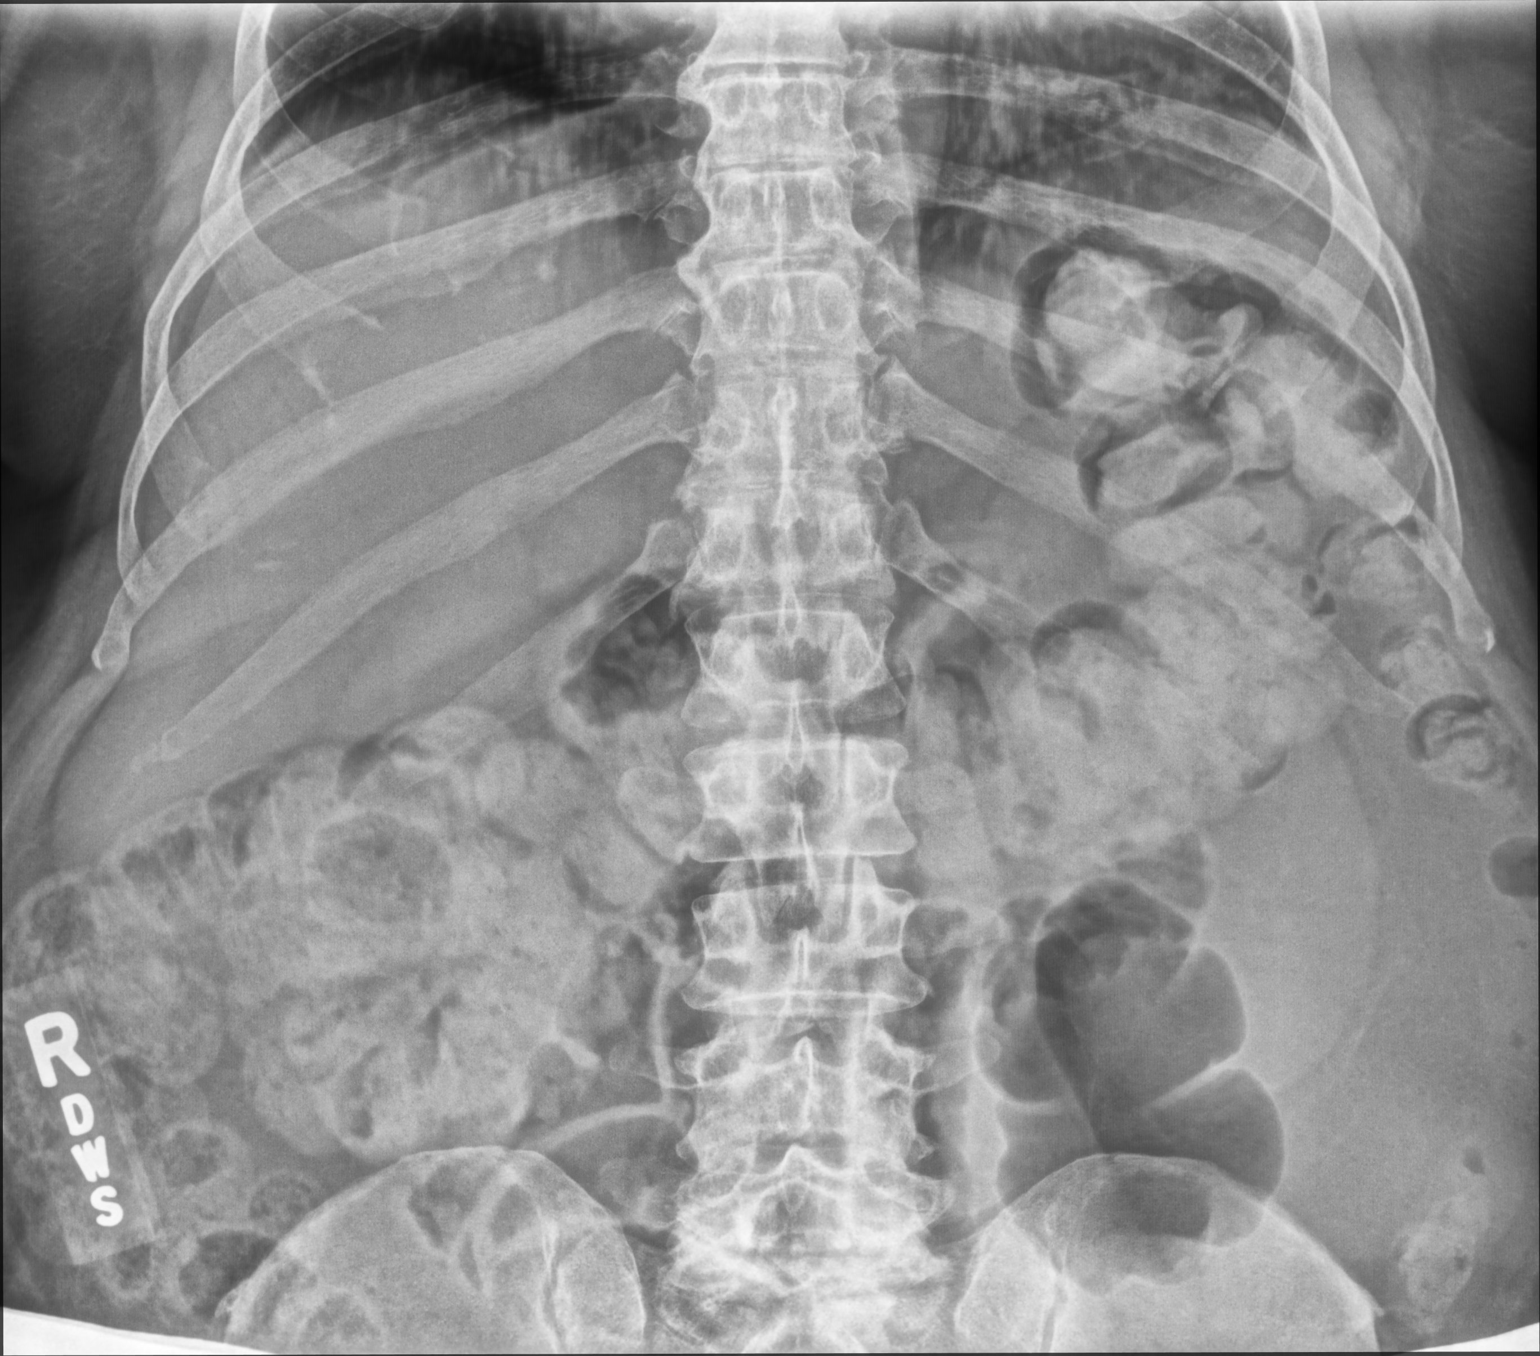

[2 of 2 positions shown; findings below may reference images not displayed]

FINDINGS: Very large colonic stool burden. Gas within upper normal caliber
sigmoid colon which is present in the mid abdomen (as noted on the
prior CT). No evidence of bowel obstruction. Numerous pelvic
phleboliths. No visible opaque urinary tract calculi. Calcified
granuloma deep in the RIGHT LOWER LOBE (identified on the prior CT)
projects over the liver. Degenerative changes involving the lower
lumbar spine.
IMPRESSION: Very large colonic stool burden. No evidence of bowel obstruction or
other acute abdominal abnormalities.

## 2021-09-25 ENCOUNTER — Telehealth: Payer: Self-pay | Admitting: Family Medicine

## 2021-09-25 ENCOUNTER — Other Ambulatory Visit: Payer: Self-pay | Admitting: *Deleted

## 2021-09-25 DIAGNOSIS — I10 Essential (primary) hypertension: Secondary | ICD-10-CM

## 2021-09-25 MED ORDER — SPIRONOLACTONE 50 MG PO TABS: ORAL_TABLET | ORAL | 0 refills | Status: DC

## 2021-09-25 NOTE — Telephone Encounter (Signed)
spironolactone (ALDACTONE) 50 MG tablet [[id number]]      Pharmacy  [id number] [place], [place] - [phone] AT [facility] OF [facility] & [address]  [address] [place], [place] [address]  Phone:  [phone]  Fax:  [phone]

## 2021-10-19 ENCOUNTER — Other Ambulatory Visit: Payer: Self-pay | Admitting: Family Medicine

## 2021-10-19 DIAGNOSIS — I10 Essential (primary) hypertension: Secondary | ICD-10-CM

## 2021-10-19 DIAGNOSIS — E1169 Type 2 diabetes mellitus with other specified complication: Secondary | ICD-10-CM

## 2021-10-19 DIAGNOSIS — Z794 Long term (current) use of insulin: Secondary | ICD-10-CM

## 2021-10-19 DIAGNOSIS — E785 Hyperlipidemia, unspecified: Secondary | ICD-10-CM

## 2021-10-21 ENCOUNTER — Ambulatory Visit (INDEPENDENT_AMBULATORY_CARE_PROVIDER_SITE_OTHER): Payer: Medicare Other | Admitting: Family Medicine

## 2021-10-21 ENCOUNTER — Encounter (INDEPENDENT_AMBULATORY_CARE_PROVIDER_SITE_OTHER): Payer: Self-pay

## 2021-10-21 ENCOUNTER — Other Ambulatory Visit: Payer: Self-pay

## 2021-10-21 VITALS — BP 149/83 | HR 64 | Temp 97.7°F | Resp 16 | Wt 201.4 lb

## 2021-10-21 DIAGNOSIS — E785 Hyperlipidemia, unspecified: Secondary | ICD-10-CM

## 2021-10-21 DIAGNOSIS — I10 Essential (primary) hypertension: Secondary | ICD-10-CM

## 2021-10-21 DIAGNOSIS — Z794 Long term (current) use of insulin: Secondary | ICD-10-CM

## 2021-10-21 DIAGNOSIS — E1169 Type 2 diabetes mellitus with other specified complication: Secondary | ICD-10-CM | POA: Diagnosis not present

## 2021-10-21 NOTE — Progress Notes (Signed)
Patient has no new concerns for provider today..  Patient care giver (mom) said he is doing well

## 2021-10-23 ENCOUNTER — Encounter: Payer: Self-pay | Admitting: Family Medicine

## 2021-10-23 NOTE — Progress Notes (Signed)
Established Patient Office Visit  Subjective:  Patient ID: Dalton Williams, male    DOB: [birth year]  Age: 66 y.o. MRN: [id number]  CC:  Chief Complaint  Patient presents with   Follow-up   Diabetes    HPI Dalton Williams presents for follow up of chronic med issues including diabetes and hypertension. Patient denies acute complaints or concerns.   Past Medical History:  Diagnosis Date   Essential hypertension    Hyperlipidemia associated with type 2 diabetes mellitus (HCC)    Mental retardation    Uncontrolled type 2 diabetes mellitus without complication, with long-term current use of insulin     Past Surgical History:  Procedure Laterality Date   NO PAST SURGERIES      Family History  Problem Relation Age of Onset   Diabetes Mother    Kidney failure Father    Diabetes Sister    Stroke Brother    Diabetes Brother    Chronic Renal Failure Brother     Social History   Socioeconomic History   Marital status: Single    Spouse name: Not on file   Number of children: Not on file   Years of education: Not on file   Highest education level: Not on file  Occupational History   Not on file  Tobacco Use   Smoking status: Never   Smokeless tobacco: Never  Vaping Use   Vaping Use: Never used  Substance and Sexual Activity   Alcohol use: No   Drug use: No   Sexual activity: Not on file  Other Topics Concern   Not on file  Social History Narrative   Not on file   Social Determinants of Health   Financial Resource Strain: Not on file  Food Insecurity: Not on file  Transportation Needs: Not on file  Physical Activity: Not on file  Stress: Not on file  Social Connections: Not on file  Intimate Partner Violence: Not on file    ROS Review of Systems  All other systems reviewed and are negative.  Objective:   Today's Vitals: BP (!) 149/83    Pulse 64    Temp 97.7 F (36.5 C) (Oral)    Resp 16    Wt 201 lb 6.4 oz (91.4 kg)    SpO2 97%    BMI 34.57 kg/m    Physical Exam Vitals and nursing note reviewed.  Constitutional:      General: He is not in acute distress. Cardiovascular:     Rate and Rhythm: Normal rate and regular rhythm.  Pulmonary:     Effort: Pulmonary effort is normal.     Breath sounds: Normal breath sounds.  Abdominal:     Palpations: Abdomen is soft.     Tenderness: There is no abdominal tenderness.  Musculoskeletal:     Right lower leg: No edema.     Left lower leg: No edema.  Neurological:     General: No focal deficit present.     Mental Status: He is alert and oriented to person, place, and time.    Assessment & Plan:   1. Type 2 diabetes mellitus with other specified complication, with long-term current use of insulin (HCC) Continue present management and monitor - HgB A1c  2. Essential hypertension Slightly elevated reading. Continue and monitor  3. Hyperlipidemia, unspecified hyperlipidemia type Continue present management.    Outpatient Encounter Medications as of 10/21/2021  Medication Sig   aspirin 81 MG chewable tablet Chew 81 mg by mouth daily.  carvedilol (COREG) 6.25 MG tablet TAKE 1 TABLET(6.25 MG) BY MOUTH TWICE DAILY WITH A MEAL   ferrous sulfate 324 (65 Fe) MG TBEC Take 1 tablet (325 mg total) by mouth every other day.   insulin NPH Human (NOVOLIN N) 100 UNIT/ML injection 40 units qam, 25 units qpm   Insulin Syringe-Needle U-100 (BD INSULIN SYRINGE U/F) 31G X 5/16" 0.5 ML MISC USE AS DIRECTED FOR INSULIN INJECTIONS TWICE DAILY   irbesartan (AVAPRO) 300 MG tablet TAKE 1 TABLET(300 MG) BY MOUTH DAILY   Multiple Vitamin (MULTIVITAMIN) tablet Take 1 tablet by mouth daily.   oxybutynin (DITROPAN) 5 MG tablet Take 1 tablet (5 mg total) by mouth 2 (two) times daily.   spironolactone (ALDACTONE) 50 MG tablet TAKE 1 TABLET(50 MG) BY MOUTH DAILY   [DISCONTINUED] amLODipine (NORVASC) 10 MG tablet TAKE 1 TABLET(10 MG) BY MOUTH DAILY   [DISCONTINUED] furosemide (LASIX) 20 MG tablet TAKE 1 TABLET(20  MG) BY MOUTH DAILY   [DISCONTINUED] metFORMIN (GLUCOPHAGE) 500 MG tablet TAKE 2 TABLETS(1000 MG) BY MOUTH TWICE DAILY WITH MEALS   [DISCONTINUED] pravastatin (PRAVACHOL) 80 MG tablet TAKE 1 TABLET(80 MG) BY MOUTH DAILY   No facility-administered encounter medications on file as of 10/21/2021.    Follow-up: No follow-ups on file.   [name], MD

## 2021-10-25 ENCOUNTER — Other Ambulatory Visit: Payer: Self-pay | Admitting: *Deleted

## 2021-10-25 DIAGNOSIS — E1169 Type 2 diabetes mellitus with other specified complication: Secondary | ICD-10-CM

## 2021-10-25 DIAGNOSIS — E785 Hyperlipidemia, unspecified: Secondary | ICD-10-CM

## 2021-10-25 MED ORDER — METFORMIN HCL 500 MG PO TABS: ORAL_TABLET | ORAL | 0 refills | Status: DC

## 2021-10-25 MED ORDER — PRAVASTATIN SODIUM 80 MG PO TABS: ORAL_TABLET | ORAL | 0 refills | Status: DC

## 2021-10-30 ENCOUNTER — Other Ambulatory Visit: Payer: Self-pay | Admitting: Family Medicine

## 2021-10-30 DIAGNOSIS — I10 Essential (primary) hypertension: Secondary | ICD-10-CM

## 2021-11-05 ENCOUNTER — Other Ambulatory Visit: Payer: Self-pay | Admitting: *Deleted

## 2021-11-05 ENCOUNTER — Telehealth: Payer: Self-pay | Admitting: *Deleted

## 2021-11-05 DIAGNOSIS — I10 Essential (primary) hypertension: Secondary | ICD-10-CM

## 2021-11-05 MED ORDER — FUROSEMIDE 20 MG PO TABS: ORAL_TABLET | ORAL | 0 refills | Status: DC

## 2021-11-05 NOTE — Telephone Encounter (Signed)
Refill has been sent to pharmacy.  

## 2021-12-27 ENCOUNTER — Other Ambulatory Visit: Payer: Self-pay | Admitting: Family Medicine

## 2021-12-27 DIAGNOSIS — I10 Essential (primary) hypertension: Secondary | ICD-10-CM

## 2021-12-27 NOTE — Telephone Encounter (Signed)
Medication Refill - Medication: spironolactone (ALDACTONE) 50 MG tablet ? ?Has the patient contacted their pharmacy? Yes.   No, more refills.  ? ?(Agent: If yes, when and what did the pharmacy advise?) ? ?Preferred Pharmacy (with phone number or street name):  ?[id number] - [place], [place] - [address] AT [address]  ?[address] [place] [address]  ?Phone: [phone] Fax: [phone]  ?Hours: Not open 24 hours  ? ?Has the patient been seen for an appointment in the last year OR does the patient have an upcoming appointment? Yes.   ? ?Agent: Please be advised that RX refills may take up to 3 business days. We ask that you follow-up with your pharmacy.  ?

## 2021-12-27 NOTE — Telephone Encounter (Signed)
Requested medication (s) are due for refill today: Yes ? ?Requested medication (s) are on the active medication list: Yes ? ?Last refill:  09/25/21 ? ?Future visit scheduled: Yes ? ?Notes to clinic:  Unable to refill per protocol due to failed labs, no updated results. ? ? ? ? ? ?Requested Prescriptions  ?Pending Prescriptions Disp Refills  ? spironolactone (ALDACTONE) 50 MG tablet 90 tablet 0  ?  Sig: TAKE 1 TABLET(50 MG) BY MOUTH DAILY  ?  ? Cardiovascular: Diuretics - Aldosterone Antagonist Failed - 12/27/2021  4:15 PM  ?  ?  Failed - Cr in normal range and within 180 days  ?  Creatinine, Ser  ?Date Value Ref Range Status  ?06/18/2020 1.91 (H) 0.76 - 1.27 mg/dL Final  ?  ?  ?  ?  Failed - K in normal range and within 180 days  ?  Potassium  ?Date Value Ref Range Status  ?06/18/2020 4.4 3.5 - 5.2 mmol/L Final  ?  ?  ?  ?  Failed - Na in normal range and within 180 days  ?  Sodium  ?Date Value Ref Range Status  ?06/18/2020 137 134 - 144 mmol/L Final  ?  ?  ?  ?  Failed - eGFR is 30 or above and within 180 days  ?  GFR calc Af Amer  ?Date Value Ref Range Status  ?06/18/2020 42 (L) >59 mL/min/1.73 Final  ?  Comment:  ?  **In accordance with recommendations from the NKF-ASN Task force,** ?  [facility] is in the process of updating its eGFR calculation to the ?  2021 CKD-EPI creatinine equation that estimates kidney function ?  without a race variable. ?  ? ?GFR calc non Af Amer  ?Date Value Ref Range Status  ?06/18/2020 37 (L) >59 mL/min/1.73 Final  ?  ?  ?  ?  Failed - Last BP in normal range  ?  BP Readings from Last 1 Encounters:  ?10/21/21 (!) 149/83  ?  ?  ?  ?  Passed - Valid encounter within last 6 months  ?  Recent Outpatient Visits   ? ?      ? 2 months ago Type 2 diabetes mellitus with other specified complication, with long-term current use of insulin ([place])  ? Primary Care at [facility], MD  ? 7 months ago Type 2 diabetes mellitus with other specified complication, with long-term current use of  insulin ([place])  ? Primary Care at [facility], MD  ? 8 months ago Type 2 diabetes mellitus with other specified complication, with long-term current use of insulin ([place])  ? Primary Care at [facility], Amy J, NP  ? 11 months ago Type 2 diabetes mellitus with other specified complication, with long-term current use of insulin ([place])  ? Primary Care at [facility], Bayard Beaver, MD  ? 1 year ago Type 2 diabetes mellitus with other specified complication, with long-term current use of insulin ([place])  ? Primary Care at [facility], Bayard Beaver, MD  ? ?  ?  ?Future Appointments   ? ?        ? In 3 weeks Dorna Mai, MD Primary Care at [facility]  ? ?  ? ? ?  ?  ?  ? ? ? ? ?

## 2022-01-02 ENCOUNTER — Other Ambulatory Visit: Payer: Self-pay | Admitting: Family Medicine

## 2022-01-02 DIAGNOSIS — I10 Essential (primary) hypertension: Secondary | ICD-10-CM

## 2022-01-02 NOTE — Telephone Encounter (Signed)
Patient mother calling back checking on the status of spironolactone (ALDACTONE) 50 MG tablet. Request was sent in on 12/27/2021. Pharmacy gave patient 3 pills to hold him over until request is sent in.  ? ?[id number] [place], [place] - [address] AT [facility] [address] Phone:  [phone]  ?Fax:  [phone]  ?  ? ?

## 2022-01-02 NOTE — Telephone Encounter (Signed)
Requested medication (s) are due for refill today: yes ? ?Requested medication (s) are on the active medication list: yes   ? ?Last refill: 09/25/21 #90 0 refill ? ?Future visit scheduled Yes 01/22/22 ? ?Notes to clinic: Failed due to labs, please review Thank you ? ?Requested Prescriptions  ?Pending Prescriptions Disp Refills  ? spironolactone (ALDACTONE) 50 MG tablet 90 tablet 0  ?  Sig: TAKE 1 TABLET(50 MG) BY MOUTH DAILY  ?  ? Cardiovascular: Diuretics - Aldosterone Antagonist Failed - 01/02/2022  3:35 PM  ?  ?  Failed - Cr in normal range and within 180 days  ?  Creatinine, Ser  ?Date Value Ref Range Status  ?06/18/2020 1.91 (H) 0.76 - 1.27 mg/dL Final  ?   ?  ?  Failed - K in normal range and within 180 days  ?  Potassium  ?Date Value Ref Range Status  ?06/18/2020 4.4 3.5 - 5.2 mmol/L Final  ?   ?  ?  Failed - Na in normal range and within 180 days  ?  Sodium  ?Date Value Ref Range Status  ?06/18/2020 137 134 - 144 mmol/L Final  ?   ?  ?  Failed - eGFR is 30 or above and within 180 days  ?  GFR calc Af Amer  ?Date Value Ref Range Status  ?06/18/2020 42 (L) >59 mL/min/1.73 Final  ?  Comment:  ?  **In accordance with recommendations from the NKF-ASN Task force,** ?  [facility] is in the process of updating its eGFR calculation to the ?  2021 CKD-EPI creatinine equation that estimates kidney function ?  without a race variable. ?  ? ?GFR calc non Af Amer  ?Date Value Ref Range Status  ?06/18/2020 37 (L) >59 mL/min/1.73 Final  ?   ?  ?  Failed - Last BP in normal range  ?  BP Readings from Last 1 Encounters:  ?10/21/21 (!) 149/83  ?   ?  ?  Passed - Valid encounter within last 6 months  ?  Recent Outpatient Visits   ? ?      ? 2 months ago Type 2 diabetes mellitus with other specified complication, with long-term current use of insulin (HCC)  ? Primary Care at [place] Wilson, Amelia, MD  ? 7 months ago Type 2 diabetes mellitus with other specified complication, with long-term current use of insulin (HCC)  ?  Primary Care at [place] Wilson, Amelia, MD  ? 8 months ago Type 2 diabetes mellitus with other specified complication, with long-term current use of insulin (HCC)  ? Primary Care at [place] Stephens, Amy J, NP  ? 11 months ago Type 2 diabetes mellitus with other specified complication, with long-term current use of insulin (HCC)  ? Primary Care at [place] Wallace, Catherine Lauren, MD  ? 1 year ago Type 2 diabetes mellitus with other specified complication, with long-term current use of insulin (HCC)  ? Primary Care at [place] Wallace, Catherine Lauren, MD  ? ?  ?  ?Future Appointments   ? ?        ? In 2 weeks Wilson, Amelia, MD Primary Care at [place]  ? ?  ? ? ?  ?  ?  ? ? ? ? ?

## 2022-01-17 ENCOUNTER — Other Ambulatory Visit: Payer: Self-pay | Admitting: Family Medicine

## 2022-01-17 DIAGNOSIS — I10 Essential (primary) hypertension: Secondary | ICD-10-CM

## 2022-01-22 ENCOUNTER — Encounter: Payer: Self-pay | Admitting: Family Medicine

## 2022-01-22 ENCOUNTER — Ambulatory Visit (INDEPENDENT_AMBULATORY_CARE_PROVIDER_SITE_OTHER): Payer: Medicare Other | Admitting: Family Medicine

## 2022-01-22 VITALS — BP 159/78 | HR 65 | Temp 98.1°F | Resp 16 | Wt 203.2 lb

## 2022-01-22 DIAGNOSIS — Z794 Long term (current) use of insulin: Secondary | ICD-10-CM | POA: Diagnosis not present

## 2022-01-22 DIAGNOSIS — E785 Hyperlipidemia, unspecified: Secondary | ICD-10-CM | POA: Diagnosis not present

## 2022-01-22 DIAGNOSIS — E1165 Type 2 diabetes mellitus with hyperglycemia: Secondary | ICD-10-CM

## 2022-01-22 DIAGNOSIS — I10 Essential (primary) hypertension: Secondary | ICD-10-CM | POA: Diagnosis not present

## 2022-01-22 MED ORDER — PRAVASTATIN SODIUM 80 MG PO TABS: ORAL_TABLET | ORAL | 1 refills | Status: DC

## 2022-01-22 MED ORDER — AMLODIPINE BESYLATE 10 MG PO TABS: 10.0000 mg | ORAL_TABLET | Freq: Every day | ORAL | 1 refills | Status: DC

## 2022-01-23 LAB — POCT GLYCOSYLATED HEMOGLOBIN (HGB A1C): Hemoglobin A1C: 9.2 % — AB (ref 4.0–5.6)

## 2022-01-23 NOTE — Progress Notes (Unsigned)
Established Patient Office Visit  Subjective    Patient ID: Dalton Williams, male    DOB: 15-Dec-1955  Age: 66 y.o. MRN: [id number]  CC:  Chief Complaint  Patient presents with   Follow-up   Diabetes   Hypertension    HPI Dalton Williams presents for routine follow up of chronic med issues including diabetes and hypertension. Patient denies acute complaints or concerns.    Outpatient Encounter Medications as of 01/22/2022  Medication Sig   aspirin 81 MG chewable tablet Chew 81 mg by mouth daily.   carvedilol (COREG) 6.25 MG tablet TAKE 1 TABLET(6.25 MG) BY MOUTH TWICE DAILY WITH A MEAL   ferrous sulfate 324 (65 Fe) MG TBEC Take 1 tablet (325 mg total) by mouth every other day.   furosemide (LASIX) 20 MG tablet TAKE 1 TABLET(20 MG) BY MOUTH DAILY   insulin NPH Human (NOVOLIN N) 100 UNIT/ML injection 40 units qam, 25 units qpm   Insulin Syringe-Needle U-100 (BD INSULIN SYRINGE U/F) 31G X 5/16" 0.5 ML MISC USE AS DIRECTED FOR INSULIN INJECTIONS TWICE DAILY   irbesartan (AVAPRO) 300 MG tablet TAKE 1 TABLET(300 MG) BY MOUTH DAILY   metFORMIN (GLUCOPHAGE) 500 MG tablet TAKE 2 TABLETS (1000) BY MOUTH TWICE DAILY WITH MEALS   Multiple Vitamin (MULTIVITAMIN) tablet Take 1 tablet by mouth daily.   oxybutynin (DITROPAN) 5 MG tablet Take 1 tablet (5 mg total) by mouth 2 (two) times daily.   spironolactone (ALDACTONE) 50 MG tablet TAKE 1 TABLET(50 MG) BY MOUTH DAILY   [DISCONTINUED] amLODipine (NORVASC) 10 MG tablet TAKE 1 TABLET(10 MG) BY MOUTH DAILY   [DISCONTINUED] pravastatin (PRAVACHOL) 80 MG tablet TAKE 1 TABLET(80 MG) BY MOUTH DAILY   amLODipine (NORVASC) 10 MG tablet Take 1 tablet (10 mg total) by mouth daily.   pravastatin (PRAVACHOL) 80 MG tablet TAKE 1 TABLET(80 MG) BY MOUTH DAILY   No facility-administered encounter medications on file as of 01/22/2022.    Past Medical History:  Diagnosis Date   Essential hypertension    Hyperlipidemia associated with type 2 diabetes mellitus  (HCC)    Mental retardation    Uncontrolled type 2 diabetes mellitus without complication, with long-term current use of insulin     Past Surgical History:  Procedure Laterality Date   NO PAST SURGERIES      Family History  Problem Relation Age of Onset   Diabetes Mother    Kidney failure Father    Diabetes Sister    Stroke Brother    Diabetes Brother    Chronic Renal Failure Brother     Social History   Socioeconomic History   Marital status: Single    Spouse name: Not on file   Number of children: Not on file   Years of education: Not on file   Highest education level: Not on file  Occupational History   Not on file  Tobacco Use   Smoking status: Never   Smokeless tobacco: Never  Vaping Use   Vaping Use: Never used  Substance and Sexual Activity   Alcohol use: No   Drug use: No   Sexual activity: Not on file  Other Topics Concern   Not on file  Social History Narrative   Not on file   Social Determinants of Health   Financial Resource Strain: Not on file  Food Insecurity: Not on file  Transportation Needs: Not on file  Physical Activity: Not on file  Stress: Not on file  Social Connections: Not on file  Intimate Partner Violence: Not on file    Review of Systems  All other systems reviewed and are negative.      Objective    BP (!) 159/78   Pulse 65   Temp 98.1 F (36.7 C) (Oral)   Resp 16   Wt 203 lb 3.2 oz (92.2 kg)   BMI 34.88 kg/m   Physical Exam Vitals and nursing note reviewed.  Constitutional:      General: He is not in acute distress. Cardiovascular:     Rate and Rhythm: Normal rate and regular rhythm.  Pulmonary:     Effort: Pulmonary effort is normal.     Breath sounds: Normal breath sounds.  Abdominal:     Palpations: Abdomen is soft.     Tenderness: There is no abdominal tenderness.  Musculoskeletal:     Right lower leg: No edema.     Left lower leg: No edema.  Neurological:     General: No focal deficit present.      Mental Status: He is alert and oriented to person, place, and time.    {Labs (Optional):23779}    Assessment & Plan:   1. Type 2 diabetes mellitus with hyperglycemia, with long-term current use of insulin (HCC) Increasing A1c and not at goal. Will refer to [facility] for diabetic med management with possible insulin start. - POCT glycosylated hemoglobin (Hb A1C)  2. Essential hypertension Continue present management and monitor - amLODipine (NORVASC) 10 MG tablet; Take 1 tablet (10 mg total) by mouth daily.  Dispense: 90 tablet; Refill: 1  3. Hyperlipidemia, unspecified hyperlipidemia type Continue present management. Meds refilled.  - pravastatin (PRAVACHOL) 80 MG tablet; TAKE 1 TABLET(80 MG) BY MOUTH DAILY  Dispense: 90 tablet; Refill: 1    Return in about 3 months (around 04/24/2022) for follow up.   Becky Sax, MD

## 2022-02-28 ENCOUNTER — Ambulatory Visit: Payer: Medicare Other | Admitting: Pharmacist

## 2022-04-01 ENCOUNTER — Telehealth: Payer: Self-pay | Admitting: Family Medicine

## 2022-04-01 ENCOUNTER — Other Ambulatory Visit: Payer: Self-pay | Admitting: *Deleted

## 2022-04-01 DIAGNOSIS — I10 Essential (primary) hypertension: Secondary | ICD-10-CM

## 2022-04-01 MED ORDER — SPIRONOLACTONE 50 MG PO TABS: ORAL_TABLET | ORAL | 0 refills | Status: DC

## 2022-04-01 NOTE — Telephone Encounter (Signed)
PT is has 1 left of this medication  asking if he can have a refill of this today please:  spironolactone (ALDACTONE) 50 MG tablet [[id number]]   [facility].

## 2022-04-01 NOTE — Telephone Encounter (Signed)
Refill has been sent.  °

## 2022-04-03 ENCOUNTER — Other Ambulatory Visit: Payer: Self-pay | Admitting: Family Medicine

## 2022-04-03 DIAGNOSIS — I10 Essential (primary) hypertension: Secondary | ICD-10-CM

## 2022-04-06 ENCOUNTER — Other Ambulatory Visit: Payer: Self-pay | Admitting: Family Medicine

## 2022-04-06 DIAGNOSIS — I10 Essential (primary) hypertension: Secondary | ICD-10-CM

## 2022-04-08 ENCOUNTER — Ambulatory Visit: Payer: Medicare Other | Attending: Internal Medicine | Admitting: Pharmacist

## 2022-04-08 ENCOUNTER — Encounter: Payer: Self-pay | Admitting: Pharmacist

## 2022-04-08 ENCOUNTER — Other Ambulatory Visit: Payer: Self-pay

## 2022-04-08 DIAGNOSIS — Z794 Long term (current) use of insulin: Secondary | ICD-10-CM

## 2022-04-08 DIAGNOSIS — E1165 Type 2 diabetes mellitus with hyperglycemia: Secondary | ICD-10-CM

## 2022-04-08 DIAGNOSIS — E1169 Type 2 diabetes mellitus with other specified complication: Secondary | ICD-10-CM

## 2022-04-08 NOTE — Progress Notes (Signed)
S:     No chief complaint on file.  Dalton Williams is a 66 y.o. male who presents for diabetes evaluation, education, and management.  PMH is significant for HTN, T2DM, Hyperlipidemia.  Patient was referred and last seen by Primary Care Provider, Dr. Andrey Campanile, on 01/22/2022.   At last visit, A1c was elevated from 7.0% to 9.2%.   Today, patient arrives in good spirits and accompanied by mother Dalton Williams. Patient reports increased carb and sugar intake leading to the elevated A1c.  Mother reports reducing the insulin dose to 20U if blood sugar is low but injects 30u BID mostly.  Family/Social History:  Fhx: Diabetes, Stroke, Kidney failure Tobacco: never smoked Alcohol: none reported  Current diabetes medications include: metformin 500 mg two tablets (1000 mg) BID. Insulin NPH (Novolin N) 30U qam and 30U qpm  Current hypertension medications include: Amlodipine 10 mg once daily, Carvedilol 6.25 mg BID, furosemide 20 mg once daily, Irbesartan 300 mg once daily, Spironolactone 50 mg once daily  Current hyperlipidemia medications include: Pravastatin 80 mg once daily  Patient reports adherence to taking all medications as prescribed.   Do you feel that your medications are working for you? yes Have you been experiencing any side effects to the medications prescribed? no Do you have any problems obtaining medications due to transportation or finances? no Insurance coverage: BCBS Medicare  Patient reports hypoglycemic events. Mother reports hypoglycemia with excessive yard work. Specifically, this occurs 1-2x/monthly.   Reported 2 hour post-meal/random blood sugars: 117, 143  Patient denies nocturia (nighttime urination).  Patient denies neuropathy (nerve pain). Patient denies visual changes. Patient reports self foot exams.   Patient reported dietary habits: Eats 3 meals/day - patient is not compliant with healthy dietary habits (eats pizza and pasta) - reports low appetite but  snacks on cheese puffs and chips  Patient-reported exercise habits:  - 30 minutes walk every morning   O:   Lab Results  Component Value Date   HGBA1C 9.2 (A) 01/23/2022   There were no vitals filed for this visit.  Lipid Panel     Component Value Date/Time   CHOL 114 06/08/2020 0924   TRIG 55 06/08/2020 0924   HDL 53 06/08/2020 0924   CHOLHDL 2.2 06/08/2020 0924   LDLCALC 48 06/08/2020 0924    Clinical Atherosclerotic Cardiovascular Disease (ASCVD): No  The ASCVD Risk score (Arnett DK, et al., 2019) failed to calculate for the following reasons:   The valid total cholesterol range is 130 to 320 mg/dL   A/P: Diabetes longstanding currently uncontrolled. Patient is able to verbalize appropriate hypoglycemia management plan. Medication adherence appears appropriate. Of note, his last renal function was assessed in 2021 and his eGFR was 42. -Continued Insulin NPH (Novolin N) 30U qam and 30U qpm -Hold metformin. Will call and discuss with pt the appropriateness based on his labs.  -F/u labs ordered: CMP14+EGFR -Treatment options included GLP-1, but patient reports low appetite so GLP-1 was not chosen. -Of note, Lantus is preferred with $35 copay under pt's insurance. Will discuss switching to Lantus on the next visit in an effort to simplify regimen and decrease hypo risk.  -Extensively discussed pathophysiology of diabetes, recommended lifestyle interventions, dietary effects on blood sugar control.  -Counseled on s/sx of and management of hypoglycemia.  -Next A1c anticipated 04/25/2022.   Written patient instructions provided. Patient verbalized understanding of treatment plan.  Total time in face to face counseling 30 minutes.    Follow-up:  Pharmacist in 1 month  Patient seen with: Deirdre Evener, PharmD Candidate [employer]  Pharmacist: Benard Halsted, PharmD, [place], [place] [phone]

## 2022-04-09 LAB — CMP14+EGFR
ALT: 18 IU/L (ref 0–44)
AST: 24 IU/L (ref 0–40)
Albumin/Globulin Ratio: 1.9 (ref 1.2–2.2)
Albumin: 4.9 g/dL (ref 3.9–4.9)
Alkaline Phosphatase: 101 IU/L (ref 44–121)
BUN/Creatinine Ratio: 12 (ref 10–24)
BUN: 21 mg/dL (ref 8–27)
Bilirubin Total: 0.5 mg/dL (ref 0.0–1.2)
CO2: 23 mmol/L (ref 20–29)
Calcium: 10.4 mg/dL — ABNORMAL HIGH (ref 8.6–10.2)
Chloride: 101 mmol/L (ref 96–106)
Creatinine, Ser: 1.82 mg/dL — ABNORMAL HIGH (ref 0.76–1.27)
Globulin, Total: 2.6 g/dL (ref 1.5–4.5)
Glucose: 150 mg/dL — ABNORMAL HIGH (ref 70–99)
Potassium: 5.1 mmol/L (ref 3.5–5.2)
Sodium: 142 mmol/L (ref 134–144)
Total Protein: 7.5 g/dL (ref 6.0–8.5)
eGFR: 41 mL/min/{1.73_m2} — ABNORMAL LOW (ref >59)

## 2022-04-09 MED ORDER — METFORMIN HCL 500 MG PO TABS: 500.0000 mg | ORAL_TABLET | Freq: Two times a day (BID) | ORAL | 1 refills | Status: DC

## 2022-04-09 NOTE — Addendum Note (Signed)
Addended by: Lois Huxley, Jeannett Senior L on: 04/09/2022 04:14 PM   Modules accepted: Orders

## 2022-04-10 ENCOUNTER — Other Ambulatory Visit: Payer: Self-pay

## 2022-04-10 DIAGNOSIS — I10 Essential (primary) hypertension: Secondary | ICD-10-CM

## 2022-04-10 MED ORDER — CARVEDILOL 6.25 MG PO TABS: ORAL_TABLET | ORAL | 0 refills | Status: DC

## 2022-04-22 NOTE — Telephone Encounter (Signed)
amLODipine (NORVASC) 10 MG tablet [[id number]]   Only has 3 left and does not have enough until upcoming appt for  Tuesday 04/29/22 which was confirmed.   Pharmacy is [facility] [place]   Mother [name] asked to get further refills but will also request this at the appt

## 2022-04-29 ENCOUNTER — Ambulatory Visit (INDEPENDENT_AMBULATORY_CARE_PROVIDER_SITE_OTHER): Payer: Medicare Other | Admitting: Family Medicine

## 2022-04-29 VITALS — BP 167/79 | HR 62 | Temp 97.7°F | Resp 16 | Wt 202.2 lb

## 2022-04-29 DIAGNOSIS — E1165 Type 2 diabetes mellitus with hyperglycemia: Secondary | ICD-10-CM | POA: Diagnosis not present

## 2022-04-29 DIAGNOSIS — E785 Hyperlipidemia, unspecified: Secondary | ICD-10-CM | POA: Diagnosis not present

## 2022-04-29 DIAGNOSIS — I10 Essential (primary) hypertension: Secondary | ICD-10-CM

## 2022-04-29 DIAGNOSIS — Z794 Long term (current) use of insulin: Secondary | ICD-10-CM

## 2022-04-29 MED ORDER — CARVEDILOL 12.5 MG PO TABS: 12.5000 mg | ORAL_TABLET | Freq: Two times a day (BID) | ORAL | 2 refills | Status: DC

## 2022-04-29 MED ORDER — AMLODIPINE BESYLATE 10 MG PO TABS: 10.0000 mg | ORAL_TABLET | Freq: Every day | ORAL | 1 refills | Status: DC

## 2022-04-29 NOTE — Progress Notes (Unsigned)
Patient is her for 30 month f/u. There are no new concerns

## 2022-04-30 ENCOUNTER — Encounter: Payer: Self-pay | Admitting: Family Medicine

## 2022-04-30 LAB — BASIC METABOLIC PANEL
BUN/Creatinine Ratio: 16 (ref 10–24)
BUN: 25 mg/dL (ref 8–27)
CO2: 21 mmol/L (ref 20–29)
Calcium: 9.8 mg/dL (ref 8.6–10.2)
Chloride: 103 mmol/L (ref 96–106)
Creatinine, Ser: 1.57 mg/dL — ABNORMAL HIGH (ref 0.76–1.27)
Glucose: 120 mg/dL — ABNORMAL HIGH (ref 70–99)
Potassium: 4.6 mmol/L (ref 3.5–5.2)
Sodium: 141 mmol/L (ref 134–144)
eGFR: 49 mL/min/{1.73_m2} — ABNORMAL LOW (ref >59)

## 2022-04-30 LAB — LIPID PANEL
Chol/HDL Ratio: 2.1 ratio (ref 0.0–5.0)
Cholesterol, Total: 133 mg/dL (ref 100–199)
HDL: 64 mg/dL (ref >39)
LDL Chol Calc (NIH): 51 mg/dL (ref 0–99)
Triglycerides: 100 mg/dL (ref 0–149)
VLDL Cholesterol Cal: 18 mg/dL (ref 5–40)

## 2022-04-30 LAB — HEMOGLOBIN A1C
Est. average glucose Bld gHb Est-mCnc: 177 mg/dL
Hgb A1c MFr Bld: 7.8 % — ABNORMAL HIGH (ref 4.8–5.6)

## 2022-04-30 LAB — ALT: ALT: 20 IU/L (ref 0–44)

## 2022-04-30 LAB — AST: AST: 29 IU/L (ref 0–40)

## 2022-04-30 NOTE — Progress Notes (Signed)
Establishe Patient Office Visit  Subjective    Patient ID: Dalton Williams, male    DOB: [birth year]  Age: 65 y.o. MRN: [id number]  CC:  Chief Complaint  Patient presents with   Follow-up    3 month    HPI Dalton Williams presents to review chronic med issues including diabetes and hypertension. Patient denies acute complaints or concerns.    Outpatient Encounter Medications as of 04/29/2022  Medication Sig   aspirin 81 MG chewable tablet Chew 81 mg by mouth daily.   carvedilol (COREG) 12.5 MG tablet Take 1 tablet (12.5 mg total) by mouth 2 (two) times daily with a meal.   ferrous sulfate 324 (65 Fe) MG TBEC Take 1 tablet (325 mg total) by mouth every other day.   furosemide (LASIX) 20 MG tablet TAKE 1 TABLET(20 MG) BY MOUTH DAILY   insulin NPH Human (NOVOLIN N) 100 UNIT/ML injection 40 units qam, 25 units qpm   Insulin Syringe-Needle U-100 (BD INSULIN SYRINGE U/F) 31G X 5/16" 0.5 ML MISC USE AS DIRECTED FOR INSULIN INJECTIONS TWICE DAILY   irbesartan (AVAPRO) 300 MG tablet TAKE 1 TABLET(300 MG) BY MOUTH DAILY   metFORMIN (GLUCOPHAGE) 500 MG tablet Take 1 tablet (500 mg total) by mouth 2 (two) times daily with a meal.   Multiple Vitamin (MULTIVITAMIN) tablet Take 1 tablet by mouth daily.   oxybutynin (DITROPAN) 5 MG tablet Take 1 tablet (5 mg total) by mouth 2 (two) times daily.   pravastatin (PRAVACHOL) 80 MG tablet TAKE 1 TABLET(80 MG) BY MOUTH DAILY   spironolactone (ALDACTONE) 50 MG tablet TAKE 1 TABLET(50 MG) BY MOUTH DAILY   [DISCONTINUED] amLODipine (NORVASC) 10 MG tablet Take 1 tablet (10 mg total) by mouth daily.   [DISCONTINUED] carvedilol (COREG) 6.25 MG tablet TAKE 1 TABLET(6.25 MG) BY MOUTH TWICE DAILY WITH A MEAL   amLODipine (NORVASC) 10 MG tablet Take 1 tablet (10 mg total) by mouth daily.   No facility-administered encounter medications on file as of 04/29/2022.    Past Medical History:  Diagnosis Date   Essential hypertension    Hyperlipidemia associated with  type 2 diabetes mellitus (HCC)    Mental retardation    Uncontrolled type 2 diabetes mellitus without complication, with long-term current use of insulin     Past Surgical History:  Procedure Laterality Date   NO PAST SURGERIES      Family History  Problem Relation Age of Onset   Diabetes Mother    Kidney failure Father    Diabetes Sister    Stroke Brother    Diabetes Brother    Chronic Renal Failure Brother     Social History   Socioeconomic History   Marital status: Single    Spouse name: Not on file   Number of children: Not on file   Years of education: Not on file   Highest education level: Not on file  Occupational History   Not on file  Tobacco Use   Smoking status: Never   Smokeless tobacco: Never  Vaping Use   Vaping Use: Never used  Substance and Sexual Activity   Alcohol use: No   Drug use: No   Sexual activity: Not on file  Other Topics Concern   Not on file  Social History Narrative   Not on file   Social Determinants of Health   Financial Resource Strain: Not on file  Food Insecurity: Not on file  Transportation Needs: Not on file  Physical Activity: Not on file  Stress: Not on file  Social Connections: Not on file  Intimate Partner Violence: Not on file    Review of Systems  All other systems reviewed and are negative.       Objective    BP (!) 167/79   Pulse 62   Temp 97.7 F (36.5 C) (Oral)   Resp 16   Wt 202 lb 3.2 oz (91.7 kg)   SpO2 96%   BMI 34.71 kg/m   Physical Exam Vitals and nursing note reviewed.  Constitutional:      General: He is not in acute distress. Cardiovascular:     Rate and Rhythm: Normal rate and regular rhythm.  Pulmonary:     Effort: Pulmonary effort is normal.     Breath sounds: Normal breath sounds.  Abdominal:     Palpations: Abdomen is soft.     Tenderness: There is no abdominal tenderness.  Musculoskeletal:     Right lower leg: No edema.     Left lower leg: No edema.  Neurological:      General: No focal deficit present.     Mental Status: He is alert and oriented to person, place, and time.         Assessment & Plan:   1. Type 2 diabetes mellitus with hyperglycemia, with long-term current use of insulin (HCC) Continue present management. Labs ordered.  - Hemoglobin A1c - Basic Metabolic Panel  2. Essential hypertension Elevated readings. Will increase coreg from 6.25 to 12.5 mg bid and monitor. Monitoring labs ordered. - AST - ALT - Lipid Panel - Basic Metabolic Panel  3. Hyperlipidemia, unspecified hyperlipidemia type Continue. Labs ordered - AST - ALT - Lipid Panel    Return in about 6 weeks (around 06/10/2022) for follow up.   [name], MD

## 2022-05-12 ENCOUNTER — Ambulatory Visit: Payer: Medicare Other | Attending: Nurse Practitioner | Admitting: Pharmacist

## 2022-05-12 VITALS — BP 129/69 | HR 56

## 2022-05-12 DIAGNOSIS — I10 Essential (primary) hypertension: Secondary | ICD-10-CM | POA: Diagnosis not present

## 2022-05-12 DIAGNOSIS — E1165 Type 2 diabetes mellitus with hyperglycemia: Secondary | ICD-10-CM | POA: Diagnosis not present

## 2022-05-12 DIAGNOSIS — Z794 Long term (current) use of insulin: Secondary | ICD-10-CM

## 2022-05-12 MED ORDER — LANTUS SOLOSTAR 100 UNIT/ML ~~LOC~~ SOPN: 42.0000 [IU] | PEN_INJECTOR | Freq: Every day | SUBCUTANEOUS | 2 refills | Status: DC

## 2022-05-12 NOTE — Progress Notes (Signed)
S:     No chief complaint on file.  Dalton Williams is a 66 y.o. male who presents for diabetes evaluation, education, and management.  PMH is significant for HTN, T2DM, Hyperlipidemia. .  Patient was referred and last seen by Primary Care Provider, Dr. Redmond Pulling, on 01/22/2022.   At last visit with Pharmacist, metformin 500 mg two tablets BID (1061m) BID was reduced to 500 mg 1 tablet BID due to reduced kidney function (eGFR of 41). Last visit with PCP A1C went down to 7.8% from 9.2%. eGFR improved to 49.  Today, patient arrives in good spirits and accompanied by mother [name] Mother is worried about low blood sugar and usually checks his blood sugar before giving insulin.   Family/Social History:  Fhx: Diabetes, Stroke, Kidney failure Tobacco: never smoked Alcohol: none reported  Current diabetes medications include: metformin 500 mg one tablets BID. Insulin NPH (Novolin N) 30U qam and 30U qpm  Current hypertension medications include: Amlodipine 10 mg once daily, Carvedilol 12.5 mg BID, furosemide 20 mg once daily, Irbesartan 300 mg once daily, Spironolactone 50 mg once daily  Current hyperlipidemia medications include: Pravastatin 80 mg once daily  Patient reports adherence to taking all medications as prescribed.   Patient reports 1 hypoglycemic event. Mother reports blood sugar of 50 once since last visit.   Reported home fasting blood sugars: 97, 127, 140   Reported 200s about 30 minutes after meal  Patient denies nocturia (nighttime urination).  Patient denies neuropathy (nerve pain). Patient denies visual changes. Patient reports self foot exams.   Patient reported dietary habits: Eats 3 meals/day -Patient tries to be compliant with diabetic diet, cuts off donuts.  -Drinks zero coke   Patient-reported exercise habits:  - 30 minutes walk every morning  O:  Vitals:   05/12/22 1534  BP: 129/69  Pulse: (!) 56   Lab Results  Component Value Date   HGBA1C  7.8 (H) 04/29/2022   Vitals:   05/12/22 1534  BP: 129/69  Pulse: (!) 56    Lipid Panel     Component Value Date/Time   CHOL 133 04/29/2022 1409   TRIG 100 04/29/2022 1409   HDL 64 04/29/2022 1409   CHOLHDL 2.1 04/29/2022 1409   LDLCALC 51 04/29/2022 1409   Clinical Atherosclerotic Cardiovascular Disease (ASCVD): No  The 10-year ASCVD risk score (Arnett DK, et al., 2019) is: 23.5%   Values used to calculate the score:     Age: 89years     Sex: Male     Is Non-Hispanic African American: Yes     Diabetic: Yes     Tobacco smoker: No     Systolic Blood Pressure: 1446mmHg     Is BP treated: Yes     HDL Cholesterol: 64 mg/dL     Total Cholesterol: 133 mg/dL   A/P: Diabetes longstanding currently uncontrolled but improved with recent A1c of 7.8%. Patient is able to verbalize appropriate hypoglycemia management plan. Medication adherence appears appropriate.  -Started basal insulin Lantus 42 U once daily  -Discontinued Novolin N.  -Continued metformin 500 mg 1 tablet twice daily. -Patient educated on purpose, proper use, and potential adverse effects of Lantus.  -Extensively discussed pathophysiology of diabetes, recommended lifestyle interventions, dietary effects on blood sugar control.  -Counseled on s/sx of and management of hypoglycemia.  -Next A1c anticipated 07/2022.   Written patient instructions provided. Patient verbalized understanding of treatment plan.  Total time in face to face counseling 30 minutes.  Follow-up: -Follow up with PCP in October, and with Pharmacist in November   Patient seen with: Deirdre Evener, PharmD Candidate [employer]  Class of 2025    Pharmacist:  Benard Halsted, PharmD, [place], [place] [phone]

## 2022-06-10 ENCOUNTER — Ambulatory Visit: Payer: Medicare Other | Admitting: Family Medicine

## 2022-06-25 ENCOUNTER — Telehealth: Payer: Self-pay | Admitting: Family Medicine

## 2022-06-25 NOTE — Telephone Encounter (Signed)
Mother [name] requesting med refill for - has 1 pill left-   spironolactone (ALDACTONE) 50 MG tablet [facility] [id number]  [place], [place] [address]  Phone:  [phone]  Fax:  [phone]

## 2022-06-26 ENCOUNTER — Other Ambulatory Visit: Payer: Self-pay | Admitting: *Deleted

## 2022-06-26 DIAGNOSIS — I10 Essential (primary) hypertension: Secondary | ICD-10-CM

## 2022-06-26 MED ORDER — SPIRONOLACTONE 50 MG PO TABS: ORAL_TABLET | ORAL | 0 refills | Status: DC

## 2022-06-26 NOTE — Telephone Encounter (Signed)
Refill sent to pharmacy.   

## 2022-06-30 ENCOUNTER — Ambulatory Visit: Payer: Medicare Other | Attending: Internal Medicine | Admitting: Pharmacist

## 2022-06-30 DIAGNOSIS — E1169 Type 2 diabetes mellitus with other specified complication: Secondary | ICD-10-CM | POA: Diagnosis not present

## 2022-06-30 DIAGNOSIS — Z794 Long term (current) use of insulin: Secondary | ICD-10-CM | POA: Diagnosis not present

## 2022-06-30 MED ORDER — INSULIN NPH (HUMAN) (ISOPHANE) 100 UNIT/ML ~~LOC~~ SUSP: 35.0000 [IU] | Freq: Two times a day (BID) | SUBCUTANEOUS | 3 refills | Status: AC

## 2022-06-30 NOTE — Progress Notes (Signed)
    S:    No chief complaint on file.  Dalton Williams is a 66 y.o. male who presents for diabetes evaluation, education, and management.  PMH is significant for HTN, T2DM, Hyperlipidemia.  Patient was referred by Primary Care Provider, Dr. Redmond Pulling, on 01/22/2022. Last seen by pharmacy team on 05/12/2022.   At last visit with pharmacist, insulin NPH (Novolin N) was changed to Lantus.   Today, patient arrives in good spirits and accompanied by mother Dalton Williams. His mother endorses that he has not started Lantus as the Lantus cost ~$70 for a 30 day supply compared to insulin NPH which is less than $30 month. She has continued to give insulin NPH 30 units BID. His mother prefers to continue using insulin NPH due to cost.  Family/Social History:  Fhx: Diabetes, Stroke, Kidney failure Tobacco: never smoked Alcohol: none reported  Current diabetes medications include: metformin 500 mg one tablets BID, insulin NPH 30 units BID; did NOT start Lantus  Patient reports adherence to taking all medications as prescribed.   Patient denies hypoglycemic event.   Reported home fasting blood sugars: 148, nothing >160 mg/dL Rarely checks PP BG. Reported 240 after eating cake the other day.   Patient denies nocturia (nighttime urination).  Patient denies neuropathy (nerve pain). Patient denies visual changes. Patient reports self foot exams.   Patient reported dietary habits: Eats 3 meals/day -Patient tries to be compliant with diabetic diet -Drinks Coke Zero   Patient-reported exercise habits:  - 30 minutes walk every morning  O:  There were no vitals filed for this visit.  Lab Results  Component Value Date   HGBA1C 7.8 (H) 04/29/2022    Lipid Panel     Component Value Date/Time   CHOL 133 04/29/2022 1409   TRIG 100 04/29/2022 1409   HDL 64 04/29/2022 1409   CHOLHDL 2.1 04/29/2022 1409   LDLCALC 51 04/29/2022 1409   Clinical Atherosclerotic Cardiovascular Disease (ASCVD): No  The  10-year ASCVD risk score (Arnett DK, et al., 2019) is: 23.5%   Values used to calculate the score:     Age: 68 years     Sex: Male     Is Non-Hispanic African American: Yes     Diabetic: Yes     Tobacco smoker: No     Systolic Blood Pressure: 093 mmHg     Is BP treated: Yes     HDL Cholesterol: 64 mg/dL     Total Cholesterol: 133 mg/dL   A/P: Diabetes longstanding currently uncontrolled but improved with recent A1c of 7.8%. Patient is able to verbalize appropriate hypoglycemia management plan. Medication adherence appears appropriate.  -Restarted Insulin NPH (Novolin N)  at 35 units BID per patient's mothers request.  -Discontinued Lantus 42 units daily.  -Continued metformin 500 mg 1 tablet twice daily. -Patient educated on purpose, proper use, and potential adverse effects of Lantus.  -Extensively discussed pathophysiology of diabetes, recommended lifestyle interventions, dietary effects on blood sugar control.  -Counseled on s/sx of and management of hypoglycemia.  -Next A1c anticipated 07/2022.   Total time in face to face counseling 20 minutes.    Follow-up: -Pharmacist PRN -Instructed patient to schedule PCP follow up visit due to recently missed visit in October.    Joseph Art, Pharm.D. PGY-2 Ambulatory Care Pharmacy Resident 06/30/2022 10:50 AM

## 2022-07-05 ENCOUNTER — Other Ambulatory Visit: Payer: Self-pay | Admitting: Family Medicine

## 2022-07-05 DIAGNOSIS — I10 Essential (primary) hypertension: Secondary | ICD-10-CM

## 2022-07-07 ENCOUNTER — Other Ambulatory Visit: Payer: Self-pay | Admitting: *Deleted

## 2022-07-07 DIAGNOSIS — I10 Essential (primary) hypertension: Secondary | ICD-10-CM

## 2022-07-07 MED ORDER — FUROSEMIDE 20 MG PO TABS: ORAL_TABLET | ORAL | 0 refills | Status: DC

## 2022-07-28 ENCOUNTER — Encounter: Payer: Self-pay | Admitting: Family Medicine

## 2022-07-28 ENCOUNTER — Ambulatory Visit (INDEPENDENT_AMBULATORY_CARE_PROVIDER_SITE_OTHER): Payer: Medicare Other | Admitting: Family Medicine

## 2022-07-28 VITALS — BP 138/80 | HR 52 | Temp 98.1°F | Resp 16 | Wt 206.0 lb

## 2022-07-28 DIAGNOSIS — E785 Hyperlipidemia, unspecified: Secondary | ICD-10-CM | POA: Diagnosis not present

## 2022-07-28 DIAGNOSIS — Z23 Encounter for immunization: Secondary | ICD-10-CM

## 2022-07-28 DIAGNOSIS — E1165 Type 2 diabetes mellitus with hyperglycemia: Secondary | ICD-10-CM

## 2022-07-28 DIAGNOSIS — Z794 Long term (current) use of insulin: Secondary | ICD-10-CM | POA: Diagnosis not present

## 2022-07-28 DIAGNOSIS — Z0001 Encounter for general adult medical examination with abnormal findings: Secondary | ICD-10-CM

## 2022-07-28 DIAGNOSIS — I10 Essential (primary) hypertension: Secondary | ICD-10-CM | POA: Diagnosis not present

## 2022-07-28 DIAGNOSIS — Z0000 Encounter for general adult medical examination without abnormal findings: Secondary | ICD-10-CM

## 2022-07-28 LAB — POCT GLYCOSYLATED HEMOGLOBIN (HGB A1C): Hemoglobin A1C: 7.3 % — AB (ref 4.0–5.6)

## 2022-07-28 NOTE — Progress Notes (Signed)
Patient is here for their 3 month follow-up Patient has no concerns today Care gaps have been discussed with patient   Subjective:   Dalton Williams is a 66 y.o. male who presents for Medicare Annual/Subsequent preventive examination.  Review of Systems    Refer to pcp       Objective:    Today's Vitals   07/28/22 1415  BP: 138/80  Pulse: (!) 52  Resp: 16  Temp: 98.1 F (36.7 C)  TempSrc: Oral  SpO2: 98%  Weight: 206 lb (93.4 kg)   Body mass index is 35.36 kg/m.     07/06/2016    1:18 PM  Advanced Directives  Does Patient Have a Medical Advance Directive? Yes  Type of Advance Directive Healthcare Power of Attorney  Does patient want to make changes to medical advance directive? No - Patient declined  Copy of Healthcare Power of Attorney in Chart? No - copy requested    Current Medications (verified) Outpatient Encounter Medications as of 07/28/2022  Medication Sig   amLODipine (NORVASC) 10 MG tablet Take 1 tablet (10 mg total) by mouth daily.   aspirin 81 MG chewable tablet Chew 81 mg by mouth daily.   carvedilol (COREG) 12.5 MG tablet Take 1 tablet (12.5 mg total) by mouth 2 (two) times daily with a meal.   ferrous sulfate 324 (65 Fe) MG TBEC Take 1 tablet (325 mg total) by mouth every other day.   furosemide (LASIX) 20 MG tablet TAKE 1 TABLET(20 MG) BY MOUTH DAILY   insulin NPH Human (NOVOLIN N) 100 UNIT/ML injection Inject 0.35 mLs (35 Units total) into the skin 2 (two) times daily before a meal.   Insulin Syringe-Needle U-100 (BD INSULIN SYRINGE U/F) 31G X 5/16" 0.5 ML MISC USE AS DIRECTED FOR INSULIN INJECTIONS TWICE DAILY   irbesartan (AVAPRO) 300 MG tablet TAKE 1 TABLET(300 MG) BY MOUTH DAILY   metFORMIN (GLUCOPHAGE) 500 MG tablet Take 1 tablet (500 mg total) by mouth 2 (two) times daily with a meal.   Multiple Vitamin (MULTIVITAMIN) tablet Take 1 tablet by mouth daily.   oxybutynin (DITROPAN) 5 MG tablet Take 1 tablet (5 mg total) by mouth 2 (two) times  daily.   pravastatin (PRAVACHOL) 80 MG tablet TAKE 1 TABLET(80 MG) BY MOUTH DAILY   spironolactone (ALDACTONE) 50 MG tablet TAKE 1 TABLET(50 MG) BY MOUTH DAILY   No facility-administered encounter medications on file as of 07/28/2022.    Allergies (verified) Penicillins   History: Past Medical History:  Diagnosis Date   Essential hypertension    Hyperlipidemia associated with type 2 diabetes mellitus (HCC)    Mental retardation    Uncontrolled type 2 diabetes mellitus without complication, with long-term current use of insulin    Past Surgical History:  Procedure Laterality Date   NO PAST SURGERIES     Family History  Problem Relation Age of Onset   Diabetes Mother    Kidney failure Father    Diabetes Sister    Stroke Brother    Diabetes Brother    Chronic Renal Failure Brother    Social History   Socioeconomic History   Marital status: Single    Spouse name: Not on file   Number of children: Not on file   Years of education: Not on file   Highest education level: Not on file  Occupational History   Not on file  Tobacco Use   Smoking status: Never   Smokeless tobacco: Never  Vaping Use   Vaping Use:  Never used  Substance and Sexual Activity   Alcohol use: No   Drug use: No   Sexual activity: Not on file  Other Topics Concern   Not on file  Social History Narrative   Not on file   Social Determinants of Health   Financial Resource Strain: Not on file  Food Insecurity: Not on file  Transportation Needs: Not on file  Physical Activity: Not on file  Stress: Not on file  Social Connections: Not on file    Tobacco Counseling Counseling given: Not Answered   Clinical Intake:  Pre-visit preparation completed: No        Diabetes: Yes CBG done?: No Did pt. bring in CBG monitor from home?: No     Diabetic?yes  Interpreter Needed?: No      Activities of Daily Living     No data to display           Patient Care Team: Georganna Skeans, MD as PCP - General (Family Medicine)  Indicate any recent Medical Services you may have received from other than [facility] providers in the past year (date may be approximate).     Assessment:   This is a routine wellness examination for Dalton Williams.  Hearing/Vision screen No results found.  Dietary issues and exercise activities discussed:     Goals Addressed   None   Depression Screen    04/29/2022    1:11 PM 01/22/2022    2:10 PM 10/21/2021    2:40 PM 05/21/2021    9:05 AM 04/26/2021    3:10 PM 01/23/2021    2:13 PM 06/07/2020   11:16 AM  PHQ 2/9 Scores  PHQ - 2 Score 0 0 0 0 0 0   PHQ- 9 Score 0 0 0 1  0   Exception Documentation       Medical reason    Fall Risk    07/28/2022    2:17 PM 01/23/2021    2:13 PM 10/10/2019   10:49 AM  Fall Risk   Falls in the past year? 0 0 0  Number falls in past yr: 0 0 0  Injury with Fall? 0 0 0  Risk for fall due to :  No Fall Risks   Follow up  Falls evaluation completed     FALL RISK PREVENTION PERTAINING TO THE HOME:  Any stairs in or around the home? Yes  If so, are there any without handrails? No  Home free of loose throw rugs in walkways, pet beds, electrical cords, etc? No  Adequate lighting in your home to reduce risk of falls? Yes   ASSISTIVE DEVICES UTILIZED TO PREVENT FALLS:  Life alert? No  Use of a cane, walker or w/c? No  Grab bars in the bathroom? No  Shower chair or bench in shower? No  Elevated toilet seat or a handicapped toilet? No   TIMED UP AND GO:  Was the test performed? Yes .  Length of time to ambulate 10 feet: 60 sec.   Gait steady and fast with assistive device  Cognitive Function:        Immunizations Immunization History  Administered Date(s) Administered   Fluad Quad(high Dose 65+) 07/28/2022   Influenza,inj,Quad PF,6+ Mos 05/26/2017, 08/03/2018, 06/06/2019, 06/08/2020   PFIZER(Purple Top)SARS-COV-2 Vaccination 01/16/2020, 02/06/2020   Pneumococcal Polysaccharide-23 01/11/2018     TDAP status: Due, Education has been provided regarding the importance of this vaccine. Advised may receive this vaccine at local pharmacy or Health Dept. Aware to  provide a copy of the vaccination record if obtained from local pharmacy or Health Dept. Verbalized acceptance and understanding.  Flu Vaccine status: Up to date  Pneumococcal vaccine status: Declined,  Education has been provided regarding the importance of this vaccine but patient still declined. Advised may receive this vaccine at local pharmacy or Health Dept. Aware to provide a copy of the vaccination record if obtained from local pharmacy or Health Dept. Verbalized acceptance and understanding.   Covid-19 vaccine status: Completed vaccines  Qualifies for Shingles Vaccine? Yes   Zostavax completed No   Shingrix Completed?: No.    Education has been provided regarding the importance of this vaccine. Patient has been advised to call insurance company to determine out of pocket expense if they have not yet received this vaccine. Advised may also receive vaccine at local pharmacy or Health Dept. Verbalized acceptance and understanding.  Screening Tests Health Maintenance  Topic Date Due   Medicare Annual Wellness (AWV)  05/26/2018   Diabetic kidney evaluation - Urine ACR  06/08/2021   Fecal DNA (Cologuard)  02/09/2022   Pneumonia Vaccine 665+ Years old (2 - PCV) 07/12/2022   FOOT EXAM  08/23/2022 (Originally 06/05/2020)   HEMOGLOBIN A1C  01/27/2023   Diabetic kidney evaluation - GFR measurement  04/30/2023   INFLUENZA VACCINE  Completed   Hepatitis C Screening  Completed   HPV VACCINES  Aged Out   DTaP/Tdap/Td  Discontinued   OPHTHALMOLOGY EXAM  Discontinued   COVID-19 Vaccine  Discontinued   HIV Screening  Discontinued   Zoster Vaccines- Shingrix  Discontinued    Health Maintenance  Health Maintenance Due  Topic Date Due   Medicare Annual Wellness (AWV)  05/26/2018   Diabetic kidney evaluation - Urine ACR   06/08/2021   Fecal DNA (Cologuard)  02/09/2022   Pneumonia Vaccine 1165+ Years old (2 - PCV) 07/12/2022    Colorectal cancer screening: Type of screening: FOBT/FIT. Completed n/a. Repeat every   years  Lung Cancer Screening: (Low Dose CT Chest recommended if Age 30-80 years, 30 pack-year currently smoking OR have quit w/in 15years.) does not qualify.   Lung Cancer Screening Referral: n/a  Additional Screening:  Hepatitis C Screening: does qualify; Completed 05/26/2017  Vision Screening: Recommended annual ophthalmology exams for early detection of glaucoma and other disorders of the eye. Is the patient up to date with their annual eye exam?  No  Who is the provider or what is the name of the office in which the patient attends annual eye exams? N/a If pt is not established with a provider, would they like to be referred to a provider to establish care?  Patient mom will make appointment for son .   Dental Screening: Recommended annual dental exams for proper oral hygiene  Community Resource Referral / Chronic Care Management: CRR required this visit?  No   CCM required this visit?  No      Plan:     I have personally reviewed and noted the following in the patient's chart:   Medical and social history Use of alcohol, tobacco or illicit drugs  Current medications and supplements including opioid prescriptions. Patient is not currently taking opioid prescriptions. Functional ability and status Nutritional status Physical activity Advanced directives List of other physicians Hospitalizations, surgeries, and ER visits in previous 12 months Vitals Screenings to include cognitive, depression, and falls Referrals and appointments  In addition, I have reviewed and discussed with patient certain preventive protocols, quality metrics, and best practice recommendations. A  written personalized care plan for preventive services as well as general preventive health recommendations were  provided to patient.     Kieth Brightly, RMA   07/28/2022   Nurse Notes:

## 2022-07-29 ENCOUNTER — Encounter: Payer: Self-pay | Admitting: Family Medicine

## 2022-07-29 NOTE — Progress Notes (Signed)
Established Patient Office Visit  Subjective    Patient ID: Dalton Williams, male    DOB: 05-23-1956  Age: 66 y.o. MRN: [id number]  CC:  Chief Complaint  Patient presents with   Follow-up   Diabetes    HPI Dalton Williams presents for routine follow up of chronic med issues. Patient denies acute complaints or concerns.    Outpatient Encounter Medications as of 07/28/2022  Medication Sig   amLODipine (NORVASC) 10 MG tablet Take 1 tablet (10 mg total) by mouth daily.   aspirin 81 MG chewable tablet Chew 81 mg by mouth daily.   carvedilol (COREG) 12.5 MG tablet Take 1 tablet (12.5 mg total) by mouth 2 (two) times daily with a meal.   ferrous sulfate 324 (65 Fe) MG TBEC Take 1 tablet (325 mg total) by mouth every other day.   furosemide (LASIX) 20 MG tablet TAKE 1 TABLET(20 MG) BY MOUTH DAILY   insulin NPH Human (NOVOLIN N) 100 UNIT/ML injection Inject 0.35 mLs (35 Units total) into the skin 2 (two) times daily before a meal.   Insulin Syringe-Needle U-100 (BD INSULIN SYRINGE U/F) 31G X 5/16" 0.5 ML MISC USE AS DIRECTED FOR INSULIN INJECTIONS TWICE DAILY   irbesartan (AVAPRO) 300 MG tablet TAKE 1 TABLET(300 MG) BY MOUTH DAILY   metFORMIN (GLUCOPHAGE) 500 MG tablet Take 1 tablet (500 mg total) by mouth 2 (two) times daily with a meal.   Multiple Vitamin (MULTIVITAMIN) tablet Take 1 tablet by mouth daily.   oxybutynin (DITROPAN) 5 MG tablet Take 1 tablet (5 mg total) by mouth 2 (two) times daily.   pravastatin (PRAVACHOL) 80 MG tablet TAKE 1 TABLET(80 MG) BY MOUTH DAILY   spironolactone (ALDACTONE) 50 MG tablet TAKE 1 TABLET(50 MG) BY MOUTH DAILY   No facility-administered encounter medications on file as of 07/28/2022.    Past Medical History:  Diagnosis Date   Essential hypertension    Hyperlipidemia associated with type 2 diabetes mellitus (HCC)    Mental retardation    Uncontrolled type 2 diabetes mellitus without complication, with long-term current use of insulin     Past  Surgical History:  Procedure Laterality Date   NO PAST SURGERIES      Family History  Problem Relation Age of Onset   Diabetes Mother    Kidney failure Father    Diabetes Sister    Stroke Brother    Diabetes Brother    Chronic Renal Failure Brother     Social History   Socioeconomic History   Marital status: Single    Spouse name: Not on file   Number of children: Not on file   Years of education: Not on file   Highest education level: Not on file  Occupational History   Not on file  Tobacco Use   Smoking status: Never   Smokeless tobacco: Never  Vaping Use   Vaping Use: Never used  Substance and Sexual Activity   Alcohol use: No   Drug use: No   Sexual activity: Not on file  Other Topics Concern   Not on file  Social History Narrative   Not on file   Social Determinants of Health   Financial Resource Strain: Not on file  Food Insecurity: Not on file  Transportation Needs: Not on file  Physical Activity: Not on file  Stress: Not on file  Social Connections: Not on file  Intimate Partner Violence: Not on file    Review of Systems  All other systems reviewed and  are negative.       Objective    BP 138/80   Pulse (!) 52   Temp 98.1 F (36.7 C) (Oral)   Resp 16   Wt 206 lb (93.4 kg)   SpO2 98%   BMI 35.36 kg/m   Physical Exam Vitals and nursing note reviewed.  Constitutional:      General: He is not in acute distress. Cardiovascular:     Rate and Rhythm: Normal rate and regular rhythm.  Pulmonary:     Effort: Pulmonary effort is normal.     Breath sounds: Normal breath sounds.  Abdominal:     Palpations: Abdomen is soft.     Tenderness: There is no abdominal tenderness.  Musculoskeletal:     Right lower leg: No edema.     Left lower leg: No edema.  Neurological:     General: No focal deficit present.     Mental Status: He is alert and oriented to person, place, and time.         Assessment & Plan:   1. Type 2 diabetes mellitus  with hyperglycemia, with long-term current use of insulin (HCC) Improved A1c and just above goal. Continue and monitor - POCT glycosylated hemoglobin (Hb A1C)  2. Essential hypertension Appears stble. continue  3. Encounter for Medicare annual wellness exam   4. Hyperlipidemia, unspecified hyperlipidemia type   5. Need for immunization against influenza  - Flu Vaccine QUAD High Dose(Fluad)    Return in about 3 months (around 10/27/2022) for follow up.   [name], MD

## 2022-08-05 ENCOUNTER — Other Ambulatory Visit: Payer: Self-pay | Admitting: Family Medicine

## 2022-08-05 ENCOUNTER — Telehealth: Payer: Self-pay | Admitting: Family Medicine

## 2022-08-05 ENCOUNTER — Other Ambulatory Visit: Payer: Self-pay | Admitting: *Deleted

## 2022-08-05 MED ORDER — CARVEDILOL 12.5 MG PO TABS: 12.5000 mg | ORAL_TABLET | Freq: Two times a day (BID) | ORAL | 2 refills | Status: DC

## 2022-08-05 NOTE — Telephone Encounter (Signed)
Asking for med refill  for carvedilol (COREG) 12.5 MG tablet [[id number]]    Pharmacy  [facility][id number] [place] Otto, [place] - [address] AT [facility] & [address] [place], [place] [address] Phone: [phone]  Fax: [phone] DEA #: [id number]

## 2022-09-19 ENCOUNTER — Other Ambulatory Visit: Payer: Self-pay | Admitting: *Deleted

## 2022-09-19 DIAGNOSIS — I10 Essential (primary) hypertension: Secondary | ICD-10-CM

## 2022-09-19 MED ORDER — SPIRONOLACTONE 50 MG PO TABS: ORAL_TABLET | ORAL | 0 refills | Status: DC

## 2022-09-23 ENCOUNTER — Other Ambulatory Visit: Payer: Self-pay | Admitting: Family Medicine

## 2022-09-23 DIAGNOSIS — I10 Essential (primary) hypertension: Secondary | ICD-10-CM

## 2022-09-23 DIAGNOSIS — E1169 Type 2 diabetes mellitus with other specified complication: Secondary | ICD-10-CM

## 2022-09-23 NOTE — Telephone Encounter (Signed)
Requested medication (s) are due for refill today: yes  Requested medication (s) are on the active medication list: yes  Last refill:  04/09/22 #180 1 RF  Future visit scheduled: yes  Notes to clinic:  overdue lab work    Requested Prescriptions  Pending Prescriptions Disp Refills   metFORMIN (GLUCOPHAGE) 500 MG tablet [Pharmacy Med Name: METFORMIN 500MG  TABLETS] 180 tablet 1    Sig: TAKE 1 TABLET(500 MG) BY MOUTH TWICE DAILY WITH A MEAL     Endocrinology:  Diabetes - Biguanides Failed - 09/23/2022  8:57 AM      Failed - Cr in normal range and within 360 days    Creatinine, Ser  Date Value Ref Range Status  04/29/2022 1.57 (H) 0.76 - 1.27 mg/dL Final         Failed - eGFR in normal range and within 360 days    GFR calc Af Amer  Date Value Ref Range Status  06/18/2020 42 (L) >59 mL/min/1.73 Final    Comment:    **In accordance with recommendations from the NKF-ASN Task force,**   [facility] is in the process of updating its eGFR calculation to the   2021 CKD-EPI creatinine equation that estimates kidney function   without a race variable.    GFR calc non Af Amer  Date Value Ref Range Status  06/18/2020 37 (L) >59 mL/min/1.73 Final   eGFR  Date Value Ref Range Status  04/29/2022 49 (L) >59 mL/min/1.73 Final         Failed - B12 Level in normal range and within 720 days    No results found for: "VITAMINB12"       Failed - CBC within normal limits and completed in the last 12 months    WBC  Date Value Ref Range Status  06/08/2020 5.7 3.4 - 10.8 x10E3/uL Final  07/06/2016 6.6 4.0 - 10.5 K/uL Final   RBC  Date Value Ref Range Status  06/08/2020 5.35 4.14 - 5.80 x10E6/uL Final  07/06/2016 6.26 (H) 4.22 - 5.81 MIL/uL Final   Hemoglobin  Date Value Ref Range Status  06/08/2020 11.4 (L) 13.0 - 17.7 g/dL Final   Hematocrit  Date Value Ref Range Status  06/08/2020 37.6 37.5 - 51.0 % Final   MCHC  Date Value Ref Range Status  06/08/2020 30.3 (L) 31.5 - 35.7 g/dL  Final  07/06/2016 31.4 30.0 - 36.0 g/dL Final   [facility]  Date Value Ref Range Status  06/08/2020 21.3 (L) 26.6 - 33.0 pg Final  07/06/2016 21.7 (L) 26.0 - 34.0 pg Final   MCV  Date Value Ref Range Status  06/08/2020 70 (L) 79 - 97 fL Final   No results found for: "PLTCOUNTKUC", "LABPLAT", "POCPLA" RDW  Date Value Ref Range Status  06/08/2020 14.8 11.6 - 15.4 % Final         Passed - HBA1C is between 0 and 7.9 and within 180 days    Hemoglobin A1C  Date Value Ref Range Status  07/28/2022 7.3 (A) 4.0 - 5.6 % Final   Hgb A1c MFr Bld  Date Value Ref Range Status  04/29/2022 7.8 (H) 4.8 - 5.6 % Final    Comment:             Prediabetes: 5.7 - 6.4          Diabetes: >6.4          Glycemic control for adults with diabetes: <7.0          Passed -  Valid encounter within last 6 months    Recent Outpatient Visits           1 month ago Type 2 diabetes mellitus with hyperglycemia, with long-term current use of insulin ([place])   [place] [facility], Clyde Canterbury, MD   2 months ago Type 2 diabetes mellitus with other specified complication, with long-term current use of insulin [facility])   [place], [place] L, RPH-CPP   4 months ago Type 2 diabetes mellitus with hyperglycemia, with long-term current use of insulin [facility])   [place], Annie Main L, RPH-CPP   4 months ago Type 2 diabetes mellitus with hyperglycemia, with long-term current use of insulin [facility])   [place] [facility], Clyde Canterbury, MD   5 months ago Type 2 diabetes mellitus with hyperglycemia, with long-term current use of insulin [facility])   [place], [place], RPH-CPP       Future Appointments             In 1 month Dorna Mai, MD [facility]

## 2022-09-29 ENCOUNTER — Other Ambulatory Visit: Payer: Self-pay | Admitting: *Deleted

## 2022-09-29 DIAGNOSIS — E785 Hyperlipidemia, unspecified: Secondary | ICD-10-CM

## 2022-09-29 MED ORDER — PRAVASTATIN SODIUM 80 MG PO TABS: ORAL_TABLET | ORAL | 1 refills | Status: DC

## 2022-10-27 ENCOUNTER — Ambulatory Visit (INDEPENDENT_AMBULATORY_CARE_PROVIDER_SITE_OTHER): Payer: Medicare Other | Admitting: Family Medicine

## 2022-10-27 ENCOUNTER — Encounter: Payer: Self-pay | Admitting: Family Medicine

## 2022-10-27 VITALS — BP 136/72 | HR 56 | Temp 98.1°F | Resp 16 | Wt 208.0 lb

## 2022-10-27 DIAGNOSIS — E785 Hyperlipidemia, unspecified: Secondary | ICD-10-CM | POA: Diagnosis not present

## 2022-10-27 DIAGNOSIS — Z794 Long term (current) use of insulin: Secondary | ICD-10-CM

## 2022-10-27 DIAGNOSIS — E1165 Type 2 diabetes mellitus with hyperglycemia: Secondary | ICD-10-CM | POA: Diagnosis not present

## 2022-10-27 DIAGNOSIS — I10 Essential (primary) hypertension: Secondary | ICD-10-CM

## 2022-10-29 ENCOUNTER — Encounter: Payer: Self-pay | Admitting: Family Medicine

## 2022-10-29 LAB — MICROALBUMIN / CREATININE URINE RATIO
Creatinine, Urine: 114.8 mg/dL
Microalb/Creat Ratio: 7 mg/g creat (ref 0–29)
Microalbumin, Urine: 8.4 ug/mL

## 2022-10-29 LAB — POCT GLYCOSYLATED HEMOGLOBIN (HGB A1C): Hemoglobin A1C: 7.2 % — AB (ref 4.0–5.6)

## 2022-10-29 NOTE — Progress Notes (Signed)
Established Patient Office Visit  Subjective    Patient ID: Dalton Williams, male    DOB: 1955/12/23  Age: 67 y.o. MRN: [id number]  CC: No chief complaint on file.   HPI Patrich Mayotte presents for routine follow up of chronic med issues. Patient denies acute complaints or concerns.    Outpatient Encounter Medications as of 10/27/2022  Medication Sig   hydrochlorothiazide (HYDRODIURIL) 25 MG tablet Take by mouth.   amLODipine (NORVASC) 10 MG tablet TAKE 1 TABLET(10 MG) BY MOUTH DAILY   aspirin 81 MG chewable tablet Chew 81 mg by mouth daily.   carvedilol (COREG) 12.5 MG tablet Take 1 tablet (12.5 mg total) by mouth 2 (two) times daily with a meal.   ferrous sulfate 324 (65 Fe) MG TBEC Take 1 tablet (325 mg total) by mouth every other day.   furosemide (LASIX) 20 MG tablet TAKE 1 TABLET(20 MG) BY MOUTH DAILY   insulin NPH Human (NOVOLIN N) 100 UNIT/ML injection Inject 0.35 mLs (35 Units total) into the skin 2 (two) times daily before a meal.   Insulin Syringe-Needle U-100 (BD INSULIN SYRINGE U/F) 31G X 5/16" 0.5 ML MISC USE AS DIRECTED FOR INSULIN INJECTIONS TWICE DAILY   irbesartan (AVAPRO) 300 MG tablet TAKE 1 TABLET(300 MG) BY MOUTH DAILY   metFORMIN (GLUCOPHAGE) 500 MG tablet TAKE 1 TABLET(500 MG) BY MOUTH TWICE DAILY WITH A MEAL   Multiple Vitamin (MULTIVITAMIN) tablet Take 1 tablet by mouth daily.   oxybutynin (DITROPAN) 5 MG tablet Take 1 tablet (5 mg total) by mouth 2 (two) times daily.   pravastatin (PRAVACHOL) 80 MG tablet TAKE 1 TABLET(80 MG) BY MOUTH DAILY   spironolactone (ALDACTONE) 50 MG tablet TAKE 1 TABLET(50 MG) BY MOUTH DAILY   No facility-administered encounter medications on file as of 10/27/2022.    Past Medical History:  Diagnosis Date   Essential hypertension    Hyperlipidemia associated with type 2 diabetes mellitus ([place])    Mental retardation    Uncontrolled type 2 diabetes mellitus without complication, with long-term current use of insulin     Past  Surgical History:  Procedure Laterality Date   NO PAST SURGERIES      Family History  Problem Relation Age of Onset   Diabetes Mother    Kidney failure Father    Diabetes Sister    Stroke Brother    Diabetes Brother    Chronic Renal Failure Brother     Social History   Socioeconomic History   Marital status: Single    Spouse name: Not on file   Number of children: Not on file   Years of education: Not on file   Highest education level: Not on file  Occupational History   Not on file  Tobacco Use   Smoking status: Never   Smokeless tobacco: Never  Vaping Use   Vaping Use: Never used  Substance and Sexual Activity   Alcohol use: No   Drug use: No   Sexual activity: Not on file  Other Topics Concern   Not on file  Social History Narrative   Not on file   Social Determinants of Health   Financial Resource Strain: Not on file  Food Insecurity: Not on file  Transportation Needs: Not on file  Physical Activity: Not on file  Stress: Not on file  Social Connections: Not on file  Intimate Partner Violence: Not on file    Review of Systems  All other systems reviewed and are negative.  Objective    BP 136/72   Pulse (!) 56   Temp 98.1 F (36.7 C) (Oral)   Resp 16   Wt 208 lb (94.3 kg)   SpO2 95%   BMI 35.70 kg/m   Physical Exam Vitals and nursing note reviewed.  Constitutional:      General: He is not in acute distress. Cardiovascular:     Rate and Rhythm: Normal rate and regular rhythm.  Pulmonary:     Effort: Pulmonary effort is normal.     Breath sounds: Normal breath sounds.  Abdominal:     Palpations: Abdomen is soft.     Tenderness: There is no abdominal tenderness.  Musculoskeletal:     Right lower leg: No edema.     Left lower leg: No edema.  Neurological:     General: No focal deficit present.     Mental Status: He is alert and oriented to person, place, and time.         Assessment & Plan:   1. Type 2 diabetes mellitus  with hyperglycemia, with long-term current use of insulin (HCC) A1c remains slightly above goal. Continue  - POCT glycosylated hemoglobin (Hb A1C) - Microalbumin / creatinine urine ratio - HM DIABETES FOOT EXAM  2. Essential hypertension Appears stable. Continue   3. Hyperlipidemia, unspecified hyperlipidemia type Continue   Return in about 3 months (around 01/27/2023) for follow up.   Becky Sax, MD

## 2022-10-30 ENCOUNTER — Other Ambulatory Visit: Payer: Self-pay | Admitting: Family Medicine

## 2022-11-13 ENCOUNTER — Telehealth: Payer: Self-pay | Admitting: Family Medicine

## 2022-11-13 ENCOUNTER — Other Ambulatory Visit: Payer: Self-pay | Admitting: *Deleted

## 2022-11-13 MED ORDER — CARVEDILOL 12.5 MG PO TABS: 12.5000 mg | ORAL_TABLET | Freq: Two times a day (BID) | ORAL | 2 refills | Status: DC

## 2022-11-13 NOTE — Telephone Encounter (Signed)
Requesting refill on Carvedilol   [facility] [place], [place] AT [place]  [address] [place], [place] [address]

## 2022-12-14 ENCOUNTER — Other Ambulatory Visit: Payer: Self-pay | Admitting: Family Medicine

## 2022-12-14 DIAGNOSIS — I10 Essential (primary) hypertension: Secondary | ICD-10-CM

## 2022-12-15 NOTE — Telephone Encounter (Signed)
Requested medication (s) are due for refill today: yes  Requested medication (s) are on the active medication list: yes  Last refill:  09/23/22 #90/0  Future visit scheduled: yes  Notes to clinic:  Unable to refill per protocol due to failed labs, no updated results.    Requested Prescriptions  Pending Prescriptions Disp Refills   furosemide (LASIX) 20 MG tablet [Pharmacy Med Name: FUROSEMIDE  TABLETS] 90 tablet 0    Sig: TAKE 1 TABLET(20 MG) BY MOUTH DAILY     Cardiovascular:  Diuretics - Loop Failed - 12/14/2022 12:04 PM      Failed - K in normal range and within 180 days    Potassium  Date Value Ref Range Status  04/29/2022 4.6 3.5 - 5.2 mmol/L Final         Failed - Ca in normal range and within 180 days    Calcium  Date Value Ref Range Status  04/29/2022 9.8 8.6 - 10.2 mg/dL Final         Failed - Na in normal range and within 180 days    Sodium  Date Value Ref Range Status  04/29/2022 141 134 - 144 mmol/L Final         Failed - Cr in normal range and within 180 days    Creatinine, Ser  Date Value Ref Range Status  04/29/2022 1.57 (H) 0.76 - 1.27 mg/dL Final         Failed - Cl in normal range and within 180 days    Chloride  Date Value Ref Range Status  04/29/2022 103 96 - 106 mmol/L Final         Failed - Mg Level in normal range and within 180 days    No results found for: "MG"       Passed - Last BP in normal range    BP Readings from Last 1 Encounters:  10/27/22 136/72         Passed - Valid encounter within last 6 months    Recent Outpatient Visits           1 month ago Type 2 diabetes mellitus with hyperglycemia, with long-term current use of insulin (HCC)   [place] [facility], Lauris Poag, MD   4 months ago Type 2 diabetes mellitus with hyperglycemia, with long-term current use of insulin (HCC)   [place] [facility], MD   5 months ago Type 2 diabetes mellitus with other  specified complication, with long-term current use of insulin [facility])   [address] [place] [facility] [place], [place] L, RPH-CPP   7 months ago Type 2 diabetes mellitus with hyperglycemia, with long-term current use of insulin [facility])   [facility] [place], Flora L, RPH-CPP   7 months ago Type 2 diabetes mellitus with hyperglycemia, with long-term current use of insulin [facility])   [place] [facility], MD       Future Appointments             In 1 month Georganna Skeans, MD [facility] at [facility]

## 2022-12-25 ENCOUNTER — Telehealth: Payer: Self-pay | Admitting: Family Medicine

## 2022-12-25 ENCOUNTER — Other Ambulatory Visit: Payer: Self-pay | Admitting: *Deleted

## 2022-12-25 DIAGNOSIS — I10 Essential (primary) hypertension: Secondary | ICD-10-CM

## 2022-12-25 MED ORDER — SPIRONOLACTONE 50 MG PO TABS: ORAL_TABLET | ORAL | 2 refills | Status: DC

## 2022-12-25 NOTE — Telephone Encounter (Signed)
Medication Refill For Spironolactone sent to [facility]

## 2023-02-09 ENCOUNTER — Ambulatory Visit (INDEPENDENT_AMBULATORY_CARE_PROVIDER_SITE_OTHER): Payer: Medicare Other | Admitting: Family Medicine

## 2023-02-09 ENCOUNTER — Encounter: Payer: Self-pay | Admitting: Family Medicine

## 2023-02-09 VITALS — BP 144/90 | HR 89 | Temp 98.0°F | Resp 16 | Wt 209.4 lb

## 2023-02-09 DIAGNOSIS — Z794 Long term (current) use of insulin: Secondary | ICD-10-CM | POA: Diagnosis not present

## 2023-02-09 DIAGNOSIS — E785 Hyperlipidemia, unspecified: Secondary | ICD-10-CM | POA: Diagnosis not present

## 2023-02-09 DIAGNOSIS — E1165 Type 2 diabetes mellitus with hyperglycemia: Secondary | ICD-10-CM | POA: Diagnosis not present

## 2023-02-09 DIAGNOSIS — I10 Essential (primary) hypertension: Secondary | ICD-10-CM

## 2023-02-09 LAB — POCT GLYCOSYLATED HEMOGLOBIN (HGB A1C): Hemoglobin A1C: 7.6 % — AB (ref 4.0–5.6)

## 2023-02-09 NOTE — Progress Notes (Signed)
Patient is here for their 3 month follow-up Patient has no concerns today Care gaps have been discussed with patient  

## 2023-02-11 ENCOUNTER — Encounter: Payer: Self-pay | Admitting: Family Medicine

## 2023-02-11 NOTE — Progress Notes (Signed)
Established Patient Office Visit  Subjective    Patient ID: Dalton Williams, male    DOB: 03-Aug-1956  Age: 67 y.o. MRN: [id number]  CC:  Chief Complaint  Patient presents with   Follow-up   Diabetes    HPI Dalton Williams presents for routine follow up of chronic med issues. Patient denies acute complaints or concerns.    Outpatient Encounter Medications as of 02/09/2023  Medication Sig   amLODipine (NORVASC) 10 MG tablet TAKE 1 TABLET(10 MG) BY MOUTH DAILY   aspirin 81 MG chewable tablet Chew 81 mg by mouth daily.   carvedilol (COREG) 12.5 MG tablet Take 1 tablet (12.5 mg total) by mouth 2 (two) times daily with a meal.   ferrous sulfate 324 (65 Fe) MG TBEC Take 1 tablet (325 mg total) by mouth every other day.   furosemide (LASIX) 20 MG tablet TAKE 1 TABLET(20 MG) BY MOUTH DAILY   hydrochlorothiazide (HYDRODIURIL) 25 MG tablet Take by mouth.   insulin NPH Human (NOVOLIN N) 100 UNIT/ML injection Inject 0.35 mLs (35 Units total) into the skin 2 (two) times daily before a meal.   Insulin Syringe-Needle U-100 (BD INSULIN SYRINGE U/F) 31G X 5/16" 0.5 ML MISC USE AS DIRECTED FOR INSULIN INJECTIONS TWICE DAILY   irbesartan (AVAPRO) 300 MG tablet TAKE 1 TABLET(300 MG) BY MOUTH DAILY   metFORMIN (GLUCOPHAGE) 500 MG tablet TAKE 1 TABLET(500 MG) BY MOUTH TWICE DAILY WITH A MEAL   Multiple Vitamin (MULTIVITAMIN) tablet Take 1 tablet by mouth daily.   oxybutynin (DITROPAN) 5 MG tablet Take 1 tablet (5 mg total) by mouth 2 (two) times daily.   pravastatin (PRAVACHOL) 80 MG tablet TAKE 1 TABLET(80 MG) BY MOUTH DAILY   spironolactone (ALDACTONE) 50 MG tablet TAKE 1 TABLET(50 MG) BY MOUTH DAILY   No facility-administered encounter medications on file as of 02/09/2023.    Past Medical History:  Diagnosis Date   Essential hypertension    Hyperlipidemia associated with type 2 diabetes mellitus (HCC)    Mental retardation    Uncontrolled type 2 diabetes mellitus without complication, with  long-term current use of insulin     Past Surgical History:  Procedure Laterality Date   NO PAST SURGERIES      Family History  Problem Relation Age of Onset   Diabetes Mother    Kidney failure Father    Diabetes Sister    Stroke Brother    Diabetes Brother    Chronic Renal Failure Brother     Social History   Socioeconomic History   Marital status: Single    Spouse name: Not on file   Number of children: Not on file   Years of education: Not on file   Highest education level: Not on file  Occupational History   Not on file  Tobacco Use   Smoking status: Never   Smokeless tobacco: Never  Vaping Use   Vaping Use: Never used  Substance and Sexual Activity   Alcohol use: No   Drug use: No   Sexual activity: Not on file  Other Topics Concern   Not on file  Social History Narrative   Not on file   Social Determinants of Health   Financial Resource Strain: Not on file  Food Insecurity: Not on file  Transportation Needs: Not on file  Physical Activity: Not on file  Stress: Not on file  Social Connections: Not on file  Intimate Partner Violence: Not on file    Review of Systems  All  other systems reviewed and are negative.       Objective    BP (!) 144/90   Pulse 89   Temp 98 F (36.7 C) (Oral)   Resp 16   Wt 209 lb 6.4 oz (95 kg)   SpO2 94%   BMI 35.94 kg/m   Physical Exam Vitals and nursing note reviewed.  Constitutional:      General: He is not in acute distress. Cardiovascular:     Rate and Rhythm: Normal rate and regular rhythm.  Pulmonary:     Effort: Pulmonary effort is normal.     Breath sounds: Normal breath sounds.  Abdominal:     Palpations: Abdomen is soft.     Tenderness: There is no abdominal tenderness.  Musculoskeletal:     Right lower leg: No edema.     Left lower leg: No edema.  Neurological:     General: No focal deficit present.     Mental Status: He is alert and oriented to person, place, and time.          Assessment & Plan:   1. Type 2 diabetes mellitus with hyperglycemia, with long-term current use of insulin (HCC) Increasing A1c and above goal. Discussed compliance.  - POCT glycosylated hemoglobin (Hb A1C)  2. Essential hypertension Slightly increased readings. Continue   3. Hyperlipidemia, unspecified hyperlipidemia type Continue     Return in about 3 months (around 05/12/2023) for follow up.   Dalton Raymond, MD

## 2023-02-13 DIAGNOSIS — E785 Hyperlipidemia, unspecified: Secondary | ICD-10-CM | POA: Diagnosis not present

## 2023-02-13 DIAGNOSIS — H25813 Combined forms of age-related cataract, bilateral: Secondary | ICD-10-CM | POA: Diagnosis not present

## 2023-02-13 DIAGNOSIS — E1169 Type 2 diabetes mellitus with other specified complication: Secondary | ICD-10-CM | POA: Diagnosis not present

## 2023-02-18 ENCOUNTER — Other Ambulatory Visit: Payer: Self-pay | Admitting: Family Medicine

## 2023-02-19 NOTE — Telephone Encounter (Signed)
Requested Prescriptions  Pending Prescriptions Disp Refills   carvedilol (COREG) 12.5 MG tablet [Pharmacy Med Name: CARVEDILOL 12.5MG  TABLETS] 180 tablet 0    Sig: TAKE 1 TABLET(12.5 MG) BY MOUTH TWICE DAILY WITH A MEAL     Cardiovascular: Beta Blockers 3 Failed - 02/18/2023  5:09 PM      Failed - Cr in normal range and within 360 days    Creatinine, Ser  Date Value Ref Range Status  04/29/2022 1.57 (H) 0.76 - 1.27 mg/dL Final         Failed - Last BP in normal range    BP Readings from Last 1 Encounters:  02/09/23 (!) 144/90         Passed - AST in normal range and within 360 days    AST  Date Value Ref Range Status  04/29/2022 29 0 - 40 IU/L Final         Passed - ALT in normal range and within 360 days    ALT  Date Value Ref Range Status  04/29/2022 20 0 - 44 IU/L Final         Passed - Last Heart Rate in normal range    Pulse Readings from Last 1 Encounters:  02/09/23 89         Passed - Valid encounter within last 6 months    Recent Outpatient Visits           1 week ago Type 2 diabetes mellitus with hyperglycemia, with long-term current use of insulin (HCC)   [place] [facility], Lauris Poag, MD   3 months ago Type 2 diabetes mellitus with hyperglycemia, with long-term current use of insulin (HCC)   [place] [facility], Lauris Poag, MD   6 months ago Type 2 diabetes mellitus with hyperglycemia, with long-term current use of insulin [facility])   [place] [facility], Lauris Poag, MD   7 months ago Type 2 diabetes mellitus with other specified complication, with long-term current use of insulin [facility])   [place] [facility] [place], [place] L, RPH-CPP   9 months ago Type 2 diabetes mellitus with hyperglycemia, with long-term current use of insulin [facility])   [place] [facility] [place], Cornelius Moras, RPH-CPP       Future Appointments              In 2 months Georganna Skeans, MD [facility]

## 2023-03-14 ENCOUNTER — Other Ambulatory Visit: Payer: Self-pay | Admitting: Family Medicine

## 2023-03-14 DIAGNOSIS — E785 Hyperlipidemia, unspecified: Secondary | ICD-10-CM

## 2023-03-14 DIAGNOSIS — I10 Essential (primary) hypertension: Secondary | ICD-10-CM

## 2023-05-14 ENCOUNTER — Ambulatory Visit: Payer: Medicare Other | Admitting: Family Medicine

## 2023-05-14 ENCOUNTER — Telehealth: Payer: Self-pay | Admitting: Family Medicine

## 2023-05-14 NOTE — Telephone Encounter (Signed)
Called patient to reschedule missed appointment, let patient know to call office back

## 2023-06-04 ENCOUNTER — Encounter: Payer: Self-pay | Admitting: Family Medicine

## 2023-06-04 ENCOUNTER — Ambulatory Visit: Payer: Medicare Other | Admitting: Family Medicine

## 2023-06-04 VITALS — BP 149/86 | HR 57 | Temp 98.3°F | Resp 18 | Ht 62.0 in | Wt 206.8 lb

## 2023-06-04 DIAGNOSIS — Z794 Long term (current) use of insulin: Secondary | ICD-10-CM | POA: Diagnosis not present

## 2023-06-04 DIAGNOSIS — E785 Hyperlipidemia, unspecified: Secondary | ICD-10-CM | POA: Diagnosis not present

## 2023-06-04 DIAGNOSIS — E1165 Type 2 diabetes mellitus with hyperglycemia: Secondary | ICD-10-CM | POA: Diagnosis not present

## 2023-06-04 DIAGNOSIS — I10 Essential (primary) hypertension: Secondary | ICD-10-CM | POA: Diagnosis not present

## 2023-06-04 DIAGNOSIS — Z23 Encounter for immunization: Secondary | ICD-10-CM | POA: Diagnosis not present

## 2023-06-04 DIAGNOSIS — Z7984 Long term (current) use of oral hypoglycemic drugs: Secondary | ICD-10-CM

## 2023-06-04 LAB — POCT GLYCOSYLATED HEMOGLOBIN (HGB A1C): Hemoglobin A1C: 7.7 % — AB (ref 4.0–5.6)

## 2023-06-08 ENCOUNTER — Encounter: Payer: Self-pay | Admitting: Family Medicine

## 2023-06-08 NOTE — Progress Notes (Signed)
Established Patient Office Visit  Subjective    Patient ID: Dalton Williams, male    DOB: 1956-06-22  Age: 67 y.o. MRN: [id number]  CC:  Chief Complaint  Patient presents with   Follow-up    HPI Dalton Williams presents for follow up of chronic med issues. Patient denies acute complaints.  Outpatient Encounter Medications as of 06/04/2023  Medication Sig   amLODipine (NORVASC) 10 MG tablet TAKE 1 TABLET(10 MG) BY MOUTH DAILY   aspirin 81 MG chewable tablet Chew 81 mg by mouth daily.   carvedilol (COREG) 12.5 MG tablet TAKE 1 TABLET(12.5 MG) BY MOUTH TWICE DAILY WITH A MEAL   ferrous sulfate 324 (65 Fe) MG TBEC Take 1 tablet (325 mg total) by mouth every other day.   furosemide (LASIX) 20 MG tablet TAKE 1 TABLET(20 MG) BY MOUTH DAILY   hydrochlorothiazide (HYDRODIURIL) 25 MG tablet Take by mouth.   insulin NPH Human (NOVOLIN N) 100 UNIT/ML injection Inject 0.35 mLs (35 Units total) into the skin 2 (two) times daily before a meal.   Insulin Syringe-Needle U-100 (BD INSULIN SYRINGE U/F) 31G X 5/16" 0.5 ML MISC USE AS DIRECTED FOR INSULIN INJECTIONS TWICE DAILY   irbesartan (AVAPRO) 300 MG tablet TAKE 1 TABLET(300 MG) BY MOUTH DAILY   metFORMIN (GLUCOPHAGE) 500 MG tablet TAKE 1 TABLET(500 MG) BY MOUTH TWICE DAILY WITH A MEAL   Multiple Vitamin (MULTIVITAMIN) tablet Take 1 tablet by mouth daily.   oxybutynin (DITROPAN) 5 MG tablet Take 1 tablet (5 mg total) by mouth 2 (two) times daily.   pravastatin (PRAVACHOL) 80 MG tablet TAKE 1 TABLET(80 MG) BY MOUTH DAILY   spironolactone (ALDACTONE) 50 MG tablet TAKE 1 TABLET(50 MG) BY MOUTH DAILY   No facility-administered encounter medications on file as of 06/04/2023.    Past Medical History:  Diagnosis Date   Essential hypertension    Hyperlipidemia associated with type 2 diabetes mellitus (HCC)    Mental retardation    Uncontrolled type 2 diabetes mellitus without complication, with long-term current use of insulin     Past  Surgical History:  Procedure Laterality Date   NO PAST SURGERIES      Family History  Problem Relation Age of Onset   Diabetes Mother    Kidney failure Father    Diabetes Sister    Stroke Brother    Diabetes Brother    Chronic Renal Failure Brother     Social History   Socioeconomic History   Marital status: Single    Spouse name: Not on file   Number of children: Not on file   Years of education: Not on file   Highest education level: Not on file  Occupational History   Not on file  Tobacco Use   Smoking status: Never   Smokeless tobacco: Never  Vaping Use   Vaping status: Never Used  Substance and Sexual Activity   Alcohol use: No   Drug use: No   Sexual activity: Not on file  Other Topics Concern   Not on file  Social History Narrative   Not on file   Social Determinants of Health   Financial Resource Strain: Low Risk  (06/04/2023)   Overall Financial Resource Strain (CARDIA)    Difficulty of Paying Living Expenses: Not hard at all  Food Insecurity: No Food Insecurity (06/04/2023)   Hunger Vital Sign    Worried About Running Out of Food in the Last Year: Never true    Ran Out of Food in the  Last Year: Never true  Transportation Needs: No Transportation Needs (06/04/2023)   PRAPARE - Administrator, Civil Service (Medical): No    Lack of Transportation (Non-Medical): No  Physical Activity: Sufficiently Active (06/04/2023)   Exercise Vital Sign    Days of Exercise per Week: 7 days    Minutes of Exercise per Session: 30 min  Stress: No Stress Concern Present (06/04/2023)   [employer] of Occupational Health - Occupational Stress Questionnaire    Feeling of Stress : Not at all  Social Connections: Moderately Integrated (06/04/2023)   Social Connection and Isolation Panel [NHANES]    Frequency of Communication with Friends and Family: More than three times a week    Frequency of Social Gatherings with Friends and Family: More than three  times a week    Attends Religious Services: More than 4 times per year    Active Member of [employer] or Organizations: Yes    Attends Banker Meetings: More than 4 times per year    Marital Status: Never married  Intimate Partner Violence: Not At Risk (06/04/2023)   Humiliation, Afraid, Rape, and Kick questionnaire    Fear of Current or Ex-Partner: No    Emotionally Abused: No    Physically Abused: No    Sexually Abused: No    Review of Systems  All other systems reviewed and are negative.       Objective    BP (!) 149/86 (BP Location: Left Arm, Patient Position: Sitting, Cuff Size: Normal)   Pulse (!) 57   Temp 98.3 F (36.8 C) (Oral)   Resp 18   Ht 5\' 2"  (1.575 m)   Wt 206 lb 12.8 oz (93.8 kg)   SpO2 93%   BMI 37.82 kg/m   Physical Exam Vitals and nursing note reviewed.  Constitutional:      General: He is not in acute distress. Cardiovascular:     Rate and Rhythm: Normal rate and regular rhythm.  Pulmonary:     Effort: Pulmonary effort is normal.     Breath sounds: Normal breath sounds.  Abdominal:     Palpations: Abdomen is soft.     Tenderness: There is no abdominal tenderness.  Musculoskeletal:     Right lower leg: No edema.     Left lower leg: No edema.  Neurological:     General: No focal deficit present.     Mental Status: He is alert and oriented to person, place, and time.         Assessment & Plan:   Type 2 diabetes mellitus with hyperglycemia, with long-term current use of insulin (HCC) -     POCT glycosylated hemoglobin (Hb A1C)  Essential hypertension  Hyperlipidemia, unspecified hyperlipidemia type  Encounter for immunization -     Flu vaccine trivalent PF, 6mos and older(Flulaval,Afluria,Fluarix,Fluzone)     Return in about 3 months (around 09/04/2023) for follow up.   [name], MD

## 2023-06-10 ENCOUNTER — Other Ambulatory Visit: Payer: Self-pay | Admitting: *Deleted

## 2023-06-10 ENCOUNTER — Telehealth: Payer: Self-pay | Admitting: Family Medicine

## 2023-06-10 NOTE — Telephone Encounter (Signed)
carvedilol (COREG) 12.5 MG table

## 2023-06-10 NOTE — Telephone Encounter (Signed)
Prescription Request  06/10/2023  LOV: 06/04/2023  What is the name of the medication or equipment? Carvedilol  Have you contacted your pharmacy to request a refill? No   Which pharmacy would you like this sent to?  [id number] [place],  - [phone] AT [facility] OF [facility] & [address] [place] [address] Phone: [phone] Fax: [phone]    Patient notified that their request is being sent to the clinical staff for review and that they should receive a response within 2 business days.   Please advise at Mobile [phone] (mobile)

## 2023-06-11 ENCOUNTER — Other Ambulatory Visit: Payer: Self-pay | Admitting: Family Medicine

## 2023-06-11 MED ORDER — CARVEDILOL 12.5 MG PO TABS: 12.5000 mg | ORAL_TABLET | Freq: Two times a day (BID) | ORAL | 1 refills | Status: DC

## 2023-06-12 ENCOUNTER — Other Ambulatory Visit: Payer: Self-pay | Admitting: Family Medicine

## 2023-06-12 DIAGNOSIS — I10 Essential (primary) hypertension: Secondary | ICD-10-CM

## 2023-07-20 ENCOUNTER — Other Ambulatory Visit: Payer: Self-pay | Admitting: Family Medicine

## 2023-07-20 ENCOUNTER — Telehealth: Payer: Self-pay | Admitting: Family Medicine

## 2023-07-20 DIAGNOSIS — I10 Essential (primary) hypertension: Secondary | ICD-10-CM

## 2023-07-20 MED ORDER — FUROSEMIDE 20 MG PO TABS
ORAL_TABLET | ORAL | 0 refills | Status: DC
Start: 2023-07-20 — End: 2023-09-16

## 2023-07-20 NOTE — Telephone Encounter (Signed)
Refill request for Furosemide 20mg  sent  to [place] [facility].

## 2023-09-04 ENCOUNTER — Other Ambulatory Visit: Payer: Self-pay

## 2023-09-04 ENCOUNTER — Other Ambulatory Visit: Payer: Self-pay | Admitting: Family Medicine

## 2023-09-04 DIAGNOSIS — E785 Hyperlipidemia, unspecified: Secondary | ICD-10-CM

## 2023-09-04 DIAGNOSIS — I10 Essential (primary) hypertension: Secondary | ICD-10-CM

## 2023-09-04 MED ORDER — AMLODIPINE BESYLATE 10 MG PO TABS: 10.0000 mg | ORAL_TABLET | Freq: Every day | ORAL | 1 refills | Status: DC

## 2023-09-04 MED ORDER — PRAVASTATIN SODIUM 80 MG PO TABS: ORAL_TABLET | ORAL | 1 refills | Status: AC

## 2023-09-04 MED ORDER — SPIRONOLACTONE 50 MG PO TABS: ORAL_TABLET | ORAL | 2 refills | Status: AC

## 2023-09-10 ENCOUNTER — Encounter: Payer: Self-pay | Admitting: Family Medicine

## 2023-09-10 ENCOUNTER — Other Ambulatory Visit: Payer: Self-pay | Admitting: Family Medicine

## 2023-09-10 ENCOUNTER — Ambulatory Visit (INDEPENDENT_AMBULATORY_CARE_PROVIDER_SITE_OTHER): Payer: Medicare Other | Admitting: Family Medicine

## 2023-09-10 VITALS — BP 150/83 | HR 59 | Temp 97.8°F | Resp 18 | Ht 62.0 in | Wt 221.0 lb

## 2023-09-10 DIAGNOSIS — E785 Hyperlipidemia, unspecified: Secondary | ICD-10-CM | POA: Diagnosis not present

## 2023-09-10 DIAGNOSIS — I10 Essential (primary) hypertension: Secondary | ICD-10-CM

## 2023-09-10 DIAGNOSIS — E1165 Type 2 diabetes mellitus with hyperglycemia: Secondary | ICD-10-CM

## 2023-09-10 DIAGNOSIS — Z794 Long term (current) use of insulin: Secondary | ICD-10-CM

## 2023-09-10 LAB — POCT GLYCOSYLATED HEMOGLOBIN (HGB A1C): Hemoglobin A1C: 7.6 % — AB (ref 4.0–5.6)

## 2023-09-10 NOTE — Telephone Encounter (Signed)
Requested medication (s) are due for refill today: yes  Requested medication (s) are on the active medication list: yes  Last refill:  07/19/23  Future visit scheduled: yes  Notes to clinic:  Unable to refill per protocol due to failed labs, no updated results.      Requested Prescriptions  Pending Prescriptions Disp Refills   furosemide (LASIX) 20 MG tablet [Pharmacy Med Name: FUROSEMIDE 20MG  TABLETS] 90 tablet 0    Sig: TAKE 1 TABLET(20 MG) BY MOUTH DAILY     Cardiovascular:  Diuretics - Loop Failed - 09/10/2023  1:50 PM      Failed - K in normal range and within 180 days    Potassium  Date Value Ref Range Status  04/29/2022 4.6 3.5 - 5.2 mmol/L Final         Failed - Ca in normal range and within 180 days    Calcium  Date Value Ref Range Status  04/29/2022 9.8 8.6 - 10.2 mg/dL Final         Failed - Na in normal range and within 180 days    Sodium  Date Value Ref Range Status  04/29/2022 141 134 - 144 mmol/L Final         Failed - Cr in normal range and within 180 days    Creatinine, Ser  Date Value Ref Range Status  04/29/2022 1.57 (H) 0.76 - 1.27 mg/dL Final         Failed - Cl in normal range and within 180 days    Chloride  Date Value Ref Range Status  04/29/2022 103 96 - 106 mmol/L Final         Failed - Mg Level in normal range and within 180 days    No results found for: "MG"       Failed - Last BP in normal range    BP Readings from Last 1 Encounters:  06/04/23 (!) 149/86         Passed - Valid encounter within last 6 months    Recent Outpatient Visits           3 months ago Type 2 diabetes mellitus with hyperglycemia, with long-term current use of insulin (HCC)   [facility], Lauris Poag, MD   7 months ago Type 2 diabetes mellitus with hyperglycemia, with long-term current use of insulin (HCC)   [facility], Lauris Poag, MD   10 months ago Type 2 diabetes mellitus with  hyperglycemia, with long-term current use of insulin (HCC)   [facility], Lauris Poag, MD   1 year ago Type 2 diabetes mellitus with hyperglycemia, with long-term current use of insulin [facility])   [facility], Lauris Poag, MD   1 year ago Type 2 diabetes mellitus with other specified complication, with long-term current use of insulin (HCC)   [facility] [place] Lois Huxley, Cornelius Moras, RPH-CPP       Future Appointments             Today Georganna Skeans, MD [facility]

## 2023-09-14 NOTE — Telephone Encounter (Signed)
Patient request furosemide 20 mg

## 2023-09-15 ENCOUNTER — Encounter: Payer: Self-pay | Admitting: Family Medicine

## 2023-09-15 NOTE — Progress Notes (Signed)
Established Patient Office Visit  Subjective    Patient ID: Dalton Williams, male    DOB: 1956/05/08  Age: 68 y.o. MRN: [id number]  CC:  Chief Complaint  Patient presents with   Follow-up    3 month    HPI Dalton Williams presents for routine follow up of diabetes and hypertension. Patient denies acute complaints or concerns.   Outpatient Encounter Medications as of 09/10/2023  Medication Sig   amLODipine (NORVASC) 10 MG tablet TAKE 1 TABLET(10 MG) BY MOUTH DAILY   amLODipine (NORVASC) 10 MG tablet Take 1 tablet (10 mg total) by mouth daily.   aspirin 81 MG chewable tablet Chew 81 mg by mouth daily.   carvedilol (COREG) 12.5 MG tablet Take 1 tablet (12.5 mg total) by mouth 2 (two) times daily with a meal.   ferrous sulfate 324 (65 Fe) MG TBEC Take 1 tablet (325 mg total) by mouth every other day.   furosemide (LASIX) 20 MG tablet TAKE 1 TABLET(20 MG) BY MOUTH DAILY   hydrochlorothiazide (HYDRODIURIL) 25 MG tablet Take by mouth.   insulin NPH Human (NOVOLIN N) 100 UNIT/ML injection Inject 0.35 mLs (35 Units total) into the skin 2 (two) times daily before a meal.   Insulin Syringe-Needle U-100 (BD INSULIN SYRINGE U/F) 31G X 5/16" 0.5 ML MISC USE AS DIRECTED FOR INSULIN INJECTIONS TWICE DAILY   irbesartan (AVAPRO) 300 MG tablet TAKE 1 TABLET(300 MG) BY MOUTH DAILY   metFORMIN (GLUCOPHAGE) 500 MG tablet TAKE 1 TABLET(500 MG) BY MOUTH TWICE DAILY WITH A MEAL   Multiple Vitamin (MULTIVITAMIN) tablet Take 1 tablet by mouth daily.   oxybutynin (DITROPAN) 5 MG tablet Take 1 tablet (5 mg total) by mouth 2 (two) times daily.   pravastatin (PRAVACHOL) 80 MG tablet TAKE 1 TABLET(80 MG) BY MOUTH DAILY   pravastatin (PRAVACHOL) 80 MG tablet TAKE 1 TABLET(80 MG) BY MOUTH DAILY   spironolactone (ALDACTONE) 50 MG tablet TAKE 1 TABLET(50 MG) BY MOUTH DAILY   spironolactone (ALDACTONE) 50 MG tablet TAKE 1 TABLET(50 MG) BY MOUTH DAILY   No facility-administered encounter medications on file as of  09/10/2023.    Past Medical History:  Diagnosis Date   Essential hypertension    Hyperlipidemia associated with type 2 diabetes mellitus (HCC)    Mental retardation    Uncontrolled type 2 diabetes mellitus without complication, with long-term current use of insulin     Past Surgical History:  Procedure Laterality Date   NO PAST SURGERIES      Family History  Problem Relation Age of Onset   Diabetes Mother    Kidney failure Father    Diabetes Sister    Stroke Brother    Diabetes Brother    Chronic Renal Failure Brother     Social History   Socioeconomic History   Marital status: Single    Spouse name: Not on file   Number of children: Not on file   Years of education: Not on file   Highest education level: Not on file  Occupational History   Not on file  Tobacco Use   Smoking status: Never   Smokeless tobacco: Never  Vaping Use   Vaping status: Never Used  Substance and Sexual Activity   Alcohol use: No   Drug use: No   Sexual activity: Not on file  Other Topics Concern   Not on file  Social History Narrative   Not on file   Social Drivers of Health   Financial Resource Strain: Low Risk  (  06/04/2023)   Overall Financial Resource Strain (CARDIA)    Difficulty of Paying Living Expenses: Not hard at all  Food Insecurity: No Food Insecurity (06/04/2023)   Hunger Vital Sign    Worried About Running Out of Food in the Last Year: Never true    Ran Out of Food in the Last Year: Never true  Transportation Needs: No Transportation Needs (06/04/2023)   PRAPARE - Administrator, Civil Service (Medical): No    Lack of Transportation (Non-Medical): No  Physical Activity: Sufficiently Active (06/04/2023)   Exercise Vital Sign    Days of Exercise per Week: 7 days    Minutes of Exercise per Session: 30 min  Stress: No Stress Concern Present (06/04/2023)   [employer] of Occupational Health - Occupational Stress Questionnaire    Feeling of Stress :  Not at all  Social Connections: Moderately Integrated (06/04/2023)   Social Connection and Isolation Panel [NHANES]    Frequency of Communication with Friends and Family: More than three times a week    Frequency of Social Gatherings with Friends and Family: More than three times a week    Attends Religious Services: More than 4 times per year    Active Member of [employer] or Organizations: Yes    Attends Banker Meetings: More than 4 times per year    Marital Status: Never married  Intimate Partner Violence: Not At Risk (06/04/2023)   Humiliation, Afraid, Rape, and Kick questionnaire    Fear of Current or Ex-Partner: No    Emotionally Abused: No    Physically Abused: No    Sexually Abused: No    Review of Systems  All other systems reviewed and are negative.       Objective    BP (!) 150/83 (BP Location: Left Arm, Patient Position: Sitting, Cuff Size: Large)   Pulse (!) 59   Temp 97.8 F (36.6 C) (Oral)   Resp 18   Ht 5\' 2"  (1.575 m)   Wt 221 lb (100.2 kg)   SpO2 93%   BMI 40.42 kg/m   Physical Exam Vitals and nursing note reviewed.  Constitutional:      General: He is not in acute distress. Cardiovascular:     Rate and Rhythm: Normal rate and regular rhythm.  Pulmonary:     Effort: Pulmonary effort is normal.     Breath sounds: Normal breath sounds.  Abdominal:     Palpations: Abdomen is soft.     Tenderness: There is no abdominal tenderness.  Musculoskeletal:     Right lower leg: No edema.     Left lower leg: No edema.  Neurological:     General: No focal deficit present.     Mental Status: He is alert and oriented to person, place, and time.         Assessment & Plan:  1. Type 2 diabetes mellitus with hyperglycemia, with long-term current use of insulin (HCC) Slight improvement of A1c but not quite at goal. Continue.  - HgB A1c  2. Essential hypertension (Primary) Slightly elevated readings. Continue   3. Hyperlipidemia, unspecified  hyperlipidemia type Continue   Return in about 3 months (around 12/09/2023) for follow up.   [name], MD

## 2023-09-17 ENCOUNTER — Telehealth: Payer: Self-pay | Admitting: Family Medicine

## 2023-09-17 NOTE — Telephone Encounter (Signed)
Patient needs Refill for metformin sent in the pharmacy

## 2023-09-18 ENCOUNTER — Other Ambulatory Visit: Payer: Self-pay | Admitting: Family Medicine

## 2023-09-18 DIAGNOSIS — Z794 Long term (current) use of insulin: Secondary | ICD-10-CM

## 2023-09-18 MED ORDER — METFORMIN HCL 500 MG PO TABS: 500.0000 mg | ORAL_TABLET | Freq: Two times a day (BID) | ORAL | 1 refills | Status: DC

## 2023-11-06 ENCOUNTER — Telehealth: Payer: Self-pay | Admitting: Family Medicine

## 2023-11-06 NOTE — Telephone Encounter (Signed)
 I called patient and voicemail picked up.  I left a message for patient to return my call.

## 2023-11-06 NOTE — Telephone Encounter (Signed)
 Needs Refill for Furosemide sent to pharmacy on file.

## 2023-11-10 ENCOUNTER — Other Ambulatory Visit: Payer: Self-pay | Admitting: Family Medicine

## 2023-11-10 DIAGNOSIS — I10 Essential (primary) hypertension: Secondary | ICD-10-CM

## 2023-11-10 MED ORDER — FUROSEMIDE 20 MG PO TABS: ORAL_TABLET | ORAL | 0 refills | Status: DC

## 2023-12-03 ENCOUNTER — Other Ambulatory Visit: Payer: Self-pay | Admitting: Family Medicine

## 2023-12-10 ENCOUNTER — Encounter: Payer: Self-pay | Admitting: Family Medicine

## 2023-12-10 ENCOUNTER — Ambulatory Visit (INDEPENDENT_AMBULATORY_CARE_PROVIDER_SITE_OTHER): Payer: Medicare Other | Admitting: Family Medicine

## 2023-12-10 VITALS — BP 150/74 | HR 57 | Temp 97.7°F | Resp 20 | Ht 62.0 in | Wt 210.6 lb

## 2023-12-10 DIAGNOSIS — Z794 Long term (current) use of insulin: Secondary | ICD-10-CM

## 2023-12-10 DIAGNOSIS — E785 Hyperlipidemia, unspecified: Secondary | ICD-10-CM | POA: Diagnosis not present

## 2023-12-10 DIAGNOSIS — E1165 Type 2 diabetes mellitus with hyperglycemia: Secondary | ICD-10-CM

## 2023-12-10 DIAGNOSIS — I10 Essential (primary) hypertension: Secondary | ICD-10-CM | POA: Diagnosis not present

## 2023-12-10 LAB — POCT GLYCOSYLATED HEMOGLOBIN (HGB A1C): Hemoglobin A1C: 7.5 % — AB (ref 4.0–5.6)

## 2023-12-16 ENCOUNTER — Encounter: Payer: Self-pay | Admitting: Family Medicine

## 2023-12-16 NOTE — Progress Notes (Signed)
 Established Patient Office Visit  Subjective    Patient ID: Dalton Williams, male    DOB: 09/25/1955  Age: 68 y.o. MRN: [id number]  CC:  Chief Complaint  Patient presents with   Follow-up    HPI Dalton Williams presents for routine follow up of chronic med issues including diabetes and hypertension. Patient reports med compliance and denies acute complaints.  Outpatient Encounter Medications as of 12/10/2023  Medication Sig   amLODipine  (NORVASC ) 10 MG tablet TAKE 1 TABLET(10 MG) BY MOUTH DAILY   amLODipine  (NORVASC ) 10 MG tablet Take 1 tablet (10 mg total) by mouth daily.   aspirin 81 MG chewable tablet Chew 81 mg by mouth daily.   carvedilol  (COREG ) 12.5 MG tablet TAKE 1 TABLET(12.5 MG) BY MOUTH TWICE DAILY WITH A MEAL   ferrous sulfate  324 (65 Fe) MG TBEC Take 1 tablet (325 mg total) by mouth every other day.   furosemide  (LASIX ) 20 MG tablet TAKE 1 TABLET(20 MG) BY MOUTH DAILY   hydrochlorothiazide (HYDRODIURIL) 25 MG tablet Take by mouth.   insulin  NPH Human (NOVOLIN N) 100 UNIT/ML injection Inject 0.35 mLs (35 Units total) into the skin 2 (two) times daily before a meal.   Insulin  Syringe-Needle U-100 (BD INSULIN  SYRINGE U/F) 31G X 5/16" 0.5 ML MISC USE AS DIRECTED FOR INSULIN  INJECTIONS TWICE DAILY   irbesartan  (AVAPRO ) 300 MG tablet TAKE 1 TABLET(300 MG) BY MOUTH DAILY   metFORMIN  (GLUCOPHAGE ) 500 MG tablet Take 1 tablet (500 mg total) by mouth 2 (two) times daily with a meal.   Multiple Vitamin (MULTIVITAMIN) tablet Take 1 tablet by mouth daily.   oxybutynin  (DITROPAN ) 5 MG tablet Take 1 tablet (5 mg total) by mouth 2 (two) times daily.   pravastatin  (PRAVACHOL ) 80 MG tablet TAKE 1 TABLET(80 MG) BY MOUTH DAILY   pravastatin  (PRAVACHOL ) 80 MG tablet TAKE 1 TABLET(80 MG) BY MOUTH DAILY   spironolactone  (ALDACTONE ) 50 MG tablet TAKE 1 TABLET(50 MG) BY MOUTH DAILY   spironolactone  (ALDACTONE ) 50 MG tablet TAKE 1 TABLET(50 MG) BY MOUTH DAILY   No facility-administered  encounter medications on file as of 12/10/2023.    Past Medical History:  Diagnosis Date   Essential hypertension    Hyperlipidemia associated with type 2 diabetes mellitus (HCC)    Mental retardation    Uncontrolled type 2 diabetes mellitus without complication, with long-term current use of insulin      Past Surgical History:  Procedure Laterality Date   NO PAST SURGERIES      Family History  Problem Relation Age of Onset   Diabetes Mother    Kidney failure Father    Diabetes Sister    Stroke Brother    Diabetes Brother    Chronic Renal Failure Brother     Social History   Socioeconomic History   Marital status: Single    Spouse name: Not on file   Number of children: Not on file   Years of education: Not on file   Highest education level: Not on file  Occupational History   Not on file  Tobacco Use   Smoking status: Never   Smokeless tobacco: Never  Vaping Use   Vaping status: Never Used  Substance and Sexual Activity   Alcohol use: No   Drug use: No   Sexual activity: Not on file  Other Topics Concern   Not on file  Social History Narrative   Not on file   Social Drivers of Health   Financial Resource Strain: Low Risk  (  06/04/2023)   Overall Financial Resource Strain (CARDIA)    Difficulty of Paying Living Expenses: Not hard at all  Food Insecurity: No Food Insecurity (06/04/2023)   Hunger Vital Sign    Worried About Running Out of Food in the Last Year: Never true    Ran Out of Food in the Last Year: Never true  Transportation Needs: No Transportation Needs (06/04/2023)   PRAPARE - Administrator, Civil Service (Medical): No    Lack of Transportation (Non-Medical): No  Physical Activity: Sufficiently Active (06/04/2023)   Exercise Vital Sign    Days of Exercise per Week: 7 days    Minutes of Exercise per Session: 30 min  Stress: No Stress Concern Present (06/04/2023)   [employer] of Occupational Health - Occupational Stress  Questionnaire    Feeling of Stress : Not at all  Social Connections: Moderately Integrated (06/04/2023)   Social Connection and Isolation Panel [NHANES]    Frequency of Communication with Friends and Family: More than three times a week    Frequency of Social Gatherings with Friends and Family: More than three times a week    Attends Religious Services: More than 4 times per year    Active Member of [employer] or Organizations: Yes    Attends Banker Meetings: More than 4 times per year    Marital Status: Never married  Intimate Partner Violence: Not At Risk (06/04/2023)   Humiliation, Afraid, Rape, and Kick questionnaire    Fear of Current or Ex-Partner: No    Emotionally Abused: No    Physically Abused: No    Sexually Abused: No    Review of Systems  All other systems reviewed and are negative.       Objective    BP (!) 150/74   Pulse (!) 57   Temp 97.7 F (36.5 C) (Oral)   Resp 20   Ht 5\' 2"  (1.575 m)   Wt 210 lb 9.6 oz (95.5 kg)   SpO2 94%   BMI 38.52 kg/m   Physical Exam Vitals and nursing note reviewed.  Constitutional:      General: He is not in acute distress.    Appearance: He is obese.  Cardiovascular:     Rate and Rhythm: Normal rate and regular rhythm.  Pulmonary:     Effort: Pulmonary effort is normal.     Breath sounds: Normal breath sounds.  Abdominal:     Palpations: Abdomen is soft.     Tenderness: There is no abdominal tenderness.  Musculoskeletal:     Right lower leg: No edema.     Left lower leg: No edema.  Neurological:     General: No focal deficit present.     Mental Status: He is alert and oriented to person, place, and time.         Assessment & Plan:   Encounter for long-term (current) use of insulin  (HCC)  Type 2 diabetes mellitus with hyperglycemia, with long-term current use of insulin  (HCC) -     POCT glycosylated hemoglobin (Hb A1C)  Essential hypertension  Hyperlipidemia, unspecified hyperlipidemia  type     Return in about 3 months (around 03/10/2024) for follow up.   Dalton Lama, MD

## 2023-12-22 ENCOUNTER — Telehealth: Payer: Self-pay | Admitting: Family Medicine

## 2023-12-22 NOTE — Telephone Encounter (Signed)
 Pt mother came in requesting refills for Spironolactone . Stated pt is completely out and [facility] pharmacy is not releasing refills. Please advise

## 2023-12-22 NOTE — Telephone Encounter (Signed)
 Called pharmacy and had medication filled and called patient/mother and made them aware   No further question or concerns

## 2024-02-09 ENCOUNTER — Telehealth: Payer: Self-pay | Admitting: Family Medicine

## 2024-02-09 ENCOUNTER — Other Ambulatory Visit: Payer: Self-pay | Admitting: Family Medicine

## 2024-02-09 DIAGNOSIS — I10 Essential (primary) hypertension: Secondary | ICD-10-CM

## 2024-02-09 MED ORDER — FUROSEMIDE 20 MG PO TABS: ORAL_TABLET | ORAL | 0 refills | Status: DC

## 2024-02-09 NOTE — Telephone Encounter (Signed)
 Patient needs refill for furosemide  sent to pharmacy

## 2024-03-28 ENCOUNTER — Encounter: Payer: Self-pay | Admitting: Family Medicine

## 2024-03-28 ENCOUNTER — Ambulatory Visit (INDEPENDENT_AMBULATORY_CARE_PROVIDER_SITE_OTHER): Admitting: Family Medicine

## 2024-03-28 VITALS — BP 165/71 | HR 62 | Ht 62.0 in | Wt 206.6 lb

## 2024-03-28 DIAGNOSIS — E785 Hyperlipidemia, unspecified: Secondary | ICD-10-CM

## 2024-03-28 DIAGNOSIS — E1165 Type 2 diabetes mellitus with hyperglycemia: Secondary | ICD-10-CM | POA: Diagnosis not present

## 2024-03-28 DIAGNOSIS — I10 Essential (primary) hypertension: Secondary | ICD-10-CM

## 2024-03-28 DIAGNOSIS — J302 Other seasonal allergic rhinitis: Secondary | ICD-10-CM | POA: Diagnosis not present

## 2024-03-28 DIAGNOSIS — Z794 Long term (current) use of insulin: Secondary | ICD-10-CM

## 2024-03-28 MED ORDER — CETIRIZINE HCL 10 MG PO TABS
10.0000 mg | ORAL_TABLET | Freq: Every day | ORAL | 11 refills | Status: AC
Start: 2024-03-28 — End: ?

## 2024-03-29 NOTE — Progress Notes (Signed)
 Established Patient Office Visit  Subjective    Patient ID: Dalton Williams, male    DOB: [birth year]  Age: 68 y.o. MRN: [id number]  CC:  Chief Complaint  Patient presents with   Medical Management of Chronic Issues    Pt having some issues with allergies     HPI Dalton Williams presents for follow up of chronic med issues including diabetes and hypertension. Patient also reports some increased allergy symptoms for the last several weeks.   Outpatient Encounter Medications as of 03/28/2024  Medication Sig   amLODipine  (NORVASC ) 10 MG tablet TAKE 1 TABLET(10 MG) BY MOUTH DAILY   amLODipine  (NORVASC ) 10 MG tablet Take 1 tablet (10 mg total) by mouth daily.   aspirin 81 MG chewable tablet Chew 81 mg by mouth daily.   carvedilol  (COREG ) 12.5 MG tablet TAKE 1 TABLET(12.5 MG) BY MOUTH TWICE DAILY WITH A MEAL   cetirizine  (ZYRTEC ) 10 MG tablet Take 1 tablet (10 mg total) by mouth daily.   ferrous sulfate  324 (65 Fe) MG TBEC Take 1 tablet (325 mg total) by mouth every other day.   furosemide  (LASIX ) 20 MG tablet TAKE 1 TABLET(20 MG) BY MOUTH DAILY   hydrochlorothiazide (HYDRODIURIL) 25 MG tablet Take by mouth.   insulin  NPH Human (NOVOLIN N) 100 UNIT/ML injection Inject 0.35 mLs (35 Units total) into the skin 2 (two) times daily before a meal.   Insulin  Syringe-Needle U-100 (BD INSULIN  SYRINGE U/F) 31G X 5/16 0.5 ML MISC USE AS DIRECTED FOR INSULIN  INJECTIONS TWICE DAILY   irbesartan  (AVAPRO ) 300 MG tablet TAKE 1 TABLET(300 MG) BY MOUTH DAILY   metFORMIN  (GLUCOPHAGE ) 500 MG tablet Take 1 tablet (500 mg total) by mouth 2 (two) times daily with a meal.   Multiple Vitamin (MULTIVITAMIN) tablet Take 1 tablet by mouth daily.   oxybutynin  (DITROPAN ) 5 MG tablet Take 1 tablet (5 mg total) by mouth 2 (two) times daily.   pravastatin  (PRAVACHOL ) 80 MG tablet TAKE 1 TABLET(80 MG) BY MOUTH DAILY   pravastatin  (PRAVACHOL ) 80 MG tablet TAKE 1 TABLET(80 MG) BY MOUTH DAILY   spironolactone  (ALDACTONE ) 50  MG tablet TAKE 1 TABLET(50 MG) BY MOUTH DAILY   spironolactone  (ALDACTONE ) 50 MG tablet TAKE 1 TABLET(50 MG) BY MOUTH DAILY   No facility-administered encounter medications on file as of 03/28/2024.    Past Medical History:  Diagnosis Date   Essential hypertension    Hyperlipidemia associated with type 2 diabetes mellitus (HCC)    Mental retardation    Uncontrolled type 2 diabetes mellitus without complication, with long-term current use of insulin      Past Surgical History:  Procedure Laterality Date   NO PAST SURGERIES      Family History  Problem Relation Age of Onset   Diabetes Mother    Kidney failure Father    Diabetes Sister    Stroke Brother    Diabetes Brother    Chronic Renal Failure Brother     Social History   Socioeconomic History   Marital status: Single    Spouse name: Not on file   Number of children: Not on file   Years of education: Not on file   Highest education level: Not on file  Occupational History   Not on file  Tobacco Use   Smoking status: Never   Smokeless tobacco: Never  Vaping Use   Vaping status: Never Used  Substance and Sexual Activity   Alcohol use: No   Drug use: No   Sexual activity:  Not on file  Other Topics Concern   Not on file  Social History Narrative   Not on file   Social Drivers of Health   Financial Resource Strain: Low Risk  (06/04/2023)   Overall Financial Resource Strain (CARDIA)    Difficulty of Paying Living Expenses: Not hard at all  Food Insecurity: No Food Insecurity (06/04/2023)   Hunger Vital Sign    Worried About Running Out of Food in the Last Year: Never true    Ran Out of Food in the Last Year: Never true  Transportation Needs: No Transportation Needs (06/04/2023)   PRAPARE - Administrator, Civil Service (Medical): No    Lack of Transportation (Non-Medical): No  Physical Activity: Sufficiently Active (06/04/2023)   Exercise Vital Sign    Days of Exercise per Week: 7 days     Minutes of Exercise per Session: 30 min  Stress: No Stress Concern Present (06/04/2023)   [employer] of Occupational Health - Occupational Stress Questionnaire    Feeling of Stress : Not at all  Social Connections: Moderately Integrated (06/04/2023)   Social Connection and Isolation Panel    Frequency of Communication with Friends and Family: More than three times a week    Frequency of Social Gatherings with Friends and Family: More than three times a week    Attends Religious Services: More than 4 times per year    Active Member of [employer] or Organizations: Yes    Attends Banker Meetings: More than 4 times per year    Marital Status: Never married  Intimate Partner Violence: Not At Risk (06/04/2023)   Humiliation, Afraid, Rape, and Kick questionnaire    Fear of Current or Ex-Partner: No    Emotionally Abused: No    Physically Abused: No    Sexually Abused: No    Review of Systems  All other systems reviewed and are negative.       Objective    BP (!) 165/71   Pulse 62   Ht 5' 2 (1.575 m)   Wt 206 lb 9.6 oz (93.7 kg)   SpO2 96%   BMI 37.79 kg/m   Physical Exam Vitals and nursing note reviewed.  Constitutional:      General: He is not in acute distress.    Appearance: He is obese.  Cardiovascular:     Rate and Rhythm: Normal rate and regular rhythm.  Pulmonary:     Effort: Pulmonary effort is normal.     Breath sounds: Normal breath sounds.  Abdominal:     Palpations: Abdomen is soft.     Tenderness: There is no abdominal tenderness.  Neurological:     General: No focal deficit present.     Mental Status: He is alert and oriented to person, place, and time.         Assessment & Plan:   Essential hypertension  Type 2 diabetes mellitus with hyperglycemia, with long-term current use of insulin  (HCC)  Hyperlipidemia, unspecified hyperlipidemia type  Seasonal allergies  Encounter for long-term (current) use of insulin  (HCC)  Other  orders -     Cetirizine  HCl; Take 1 tablet (10 mg total) by mouth daily.  Dispense: 30 tablet; Refill: 11     Return in about 3 months (around 06/28/2024) for follow up.   Tanda Raguel SQUIBB, MD

## 2024-04-06 ENCOUNTER — Telehealth: Payer: Self-pay | Admitting: Family Medicine

## 2024-04-06 NOTE — Telephone Encounter (Signed)
 Pt mom requesting refill for Metformin 500mg  sent to pharmacy

## 2024-04-14 ENCOUNTER — Other Ambulatory Visit: Payer: Self-pay | Admitting: Family Medicine

## 2024-04-14 DIAGNOSIS — E1169 Type 2 diabetes mellitus with other specified complication: Secondary | ICD-10-CM

## 2024-04-14 MED ORDER — METFORMIN HCL 500 MG PO TABS: 500.0000 mg | ORAL_TABLET | Freq: Two times a day (BID) | ORAL | 1 refills | Status: AC

## 2024-05-09 ENCOUNTER — Other Ambulatory Visit: Payer: Self-pay | Admitting: Family Medicine

## 2024-05-09 ENCOUNTER — Telehealth: Payer: Self-pay | Admitting: Family Medicine

## 2024-05-09 DIAGNOSIS — I10 Essential (primary) hypertension: Secondary | ICD-10-CM

## 2024-05-09 MED ORDER — FUROSEMIDE 20 MG PO TABS: ORAL_TABLET | ORAL | 0 refills | Status: DC

## 2024-05-09 NOTE — Telephone Encounter (Signed)
 Pt mom requesting Furosemide  refill to be sent to pharmacy

## 2024-06-02 ENCOUNTER — Other Ambulatory Visit: Payer: Self-pay

## 2024-06-02 NOTE — Telephone Encounter (Signed)
 Refill request for carvedilol  received via fax [facility]

## 2024-06-03 MED ORDER — CARVEDILOL 12.5 MG PO TABS: 12.5000 mg | ORAL_TABLET | Freq: Two times a day (BID) | ORAL | 1 refills | Status: AC

## 2024-06-17 NOTE — Progress Notes (Addendum)
 Dalton Williams                                          MRN: [id number]   06/17/2024   The [facility] Quality Team Specialist reviewed this patient medical record for the purposes of chart review for care gap closure. The following were reviewed: chart review for care gap closure-controlling blood pressure and kidney health evaluation for diabetes:eGFR  and uACR.    [facility] Quality Team

## 2024-06-28 ENCOUNTER — Encounter: Payer: Self-pay | Admitting: Family Medicine

## 2024-06-28 ENCOUNTER — Ambulatory Visit: Admitting: Family Medicine

## 2024-06-28 VITALS — BP 168/69 | HR 51 | Ht 62.0 in | Wt 203.0 lb

## 2024-06-28 DIAGNOSIS — F819 Developmental disorder of scholastic skills, unspecified: Secondary | ICD-10-CM

## 2024-06-28 DIAGNOSIS — J302 Other seasonal allergic rhinitis: Secondary | ICD-10-CM | POA: Diagnosis not present

## 2024-06-28 DIAGNOSIS — Z794 Long term (current) use of insulin: Secondary | ICD-10-CM

## 2024-06-28 DIAGNOSIS — E785 Hyperlipidemia, unspecified: Secondary | ICD-10-CM | POA: Diagnosis not present

## 2024-06-28 DIAGNOSIS — I10 Essential (primary) hypertension: Secondary | ICD-10-CM | POA: Diagnosis not present

## 2024-06-28 DIAGNOSIS — E1169 Type 2 diabetes mellitus with other specified complication: Secondary | ICD-10-CM | POA: Diagnosis not present

## 2024-06-28 NOTE — Progress Notes (Unsigned)
 Established Patient Office Visit  Subjective    Patient ID: Dalton Williams, male    DOB: 01/30/1956  Age: 68 y.o. MRN: [id number]  CC:  Chief Complaint  Patient presents with   Medical Management of Chronic Issues    HPI Dalton Williams presents for routine follow up of chronic med issues including hypertension and diabetes. Patient reports med compliance and denies acute complaints.   Outpatient Encounter Medications as of 06/28/2024  Medication Sig   amLODipine  (NORVASC ) 10 MG tablet TAKE 1 TABLET(10 MG) BY MOUTH DAILY   aspirin 81 MG chewable tablet Chew 81 mg by mouth daily.   carvedilol  (COREG ) 12.5 MG tablet Take 1 tablet (12.5 mg total) by mouth 2 (two) times daily with a meal.   cetirizine  (ZYRTEC ) 10 MG tablet Take 1 tablet (10 mg total) by mouth daily.   ferrous sulfate  324 (65 Fe) MG TBEC Take 1 tablet (325 mg total) by mouth every other day.   furosemide  (LASIX ) 20 MG tablet TAKE 1 TABLET(20 MG) BY MOUTH DAILY   hydrochlorothiazide (HYDRODIURIL) 25 MG tablet Take by mouth.   insulin  NPH Human (NOVOLIN N) 100 UNIT/ML injection Inject 0.35 mLs (35 Units total) into the skin 2 (two) times daily before a meal.   Insulin  Syringe-Needle U-100 (BD INSULIN  SYRINGE U/F) 31G X 5/16 0.5 ML MISC USE AS DIRECTED FOR INSULIN  INJECTIONS TWICE DAILY   irbesartan  (AVAPRO ) 300 MG tablet TAKE 1 TABLET(300 MG) BY MOUTH DAILY   metFORMIN  (GLUCOPHAGE ) 500 MG tablet Take 1 tablet (500 mg total) by mouth 2 (two) times daily with a meal.   Multiple Vitamin (MULTIVITAMIN) tablet Take 1 tablet by mouth daily.   pravastatin  (PRAVACHOL ) 80 MG tablet TAKE 1 TABLET(80 MG) BY MOUTH DAILY   spironolactone  (ALDACTONE ) 50 MG tablet TAKE 1 TABLET(50 MG) BY MOUTH DAILY   amLODipine  (NORVASC ) 10 MG tablet Take 1 tablet (10 mg total) by mouth daily.   oxybutynin  (DITROPAN ) 5 MG tablet Take 1 tablet (5 mg total) by mouth 2 (two) times daily. (Patient not taking: Reported on 06/28/2024)   pravastatin   (PRAVACHOL ) 80 MG tablet TAKE 1 TABLET(80 MG) BY MOUTH DAILY   spironolactone  (ALDACTONE ) 50 MG tablet TAKE 1 TABLET(50 MG) BY MOUTH DAILY   No facility-administered encounter medications on file as of 06/28/2024.    Past Medical History:  Diagnosis Date   Essential hypertension    Hyperlipidemia associated with type 2 diabetes mellitus (HCC)    Mental retardation    Uncontrolled type 2 diabetes mellitus without complication, with long-term current use of insulin      Past Surgical History:  Procedure Laterality Date   NO PAST SURGERIES      Family History  Problem Relation Age of Onset   Diabetes Mother    Kidney failure Father    Diabetes Sister    Stroke Brother    Diabetes Brother    Chronic Renal Failure Brother     Social History   Socioeconomic History   Marital status: Single    Spouse name: Not on file   Number of children: Not on file   Years of education: Not on file   Highest education level: Not on file  Occupational History   Not on file  Tobacco Use   Smoking status: Never   Smokeless tobacco: Never  Vaping Use   Vaping status: Never Used  Substance and Sexual Activity   Alcohol use: No   Drug use: No   Sexual activity: Not on file  Other Topics Concern   Not on file  Social History Narrative   Not on file   Social Drivers of Health   Financial Resource Strain: Low Risk  (06/28/2024)   Overall Financial Resource Strain (CARDIA)    Difficulty of Paying Living Expenses: Not hard at all  Food Insecurity: No Food Insecurity (06/04/2023)   Hunger Vital Sign    Worried About Running Out of Food in the Last Year: Never true    Ran Out of Food in the Last Year: Never true  Transportation Needs: No Transportation Needs (06/04/2023)   PRAPARE - Administrator, Civil Service (Medical): No    Lack of Transportation (Non-Medical): No  Physical Activity: Sufficiently Active (06/28/2024)   Exercise Vital Sign    Days of Exercise per Week: 7  days    Minutes of Exercise per Session: 30 min  Stress: No Stress Concern Present (06/28/2024)   [employer] of Occupational Health - Occupational Stress Questionnaire    Feeling of Stress: Not at all  Social Connections: Moderately Integrated (06/04/2023)   Social Connection and Isolation Panel    Frequency of Communication with Friends and Family: More than three times a week    Frequency of Social Gatherings with Friends and Family: More than three times a week    Attends Religious Services: More than 4 times per year    Active Member of [employer] or Organizations: Yes    Attends Banker Meetings: More than 4 times per year    Marital Status: Never married  Intimate Partner Violence: Not At Risk (06/04/2023)   Humiliation, Afraid, Rape, and Kick questionnaire    Fear of Current or Ex-Partner: No    Emotionally Abused: No    Physically Abused: No    Sexually Abused: No    Review of Systems  All other systems reviewed and are negative.       Objective    BP (!) 168/69   Pulse (!) 51   Ht 5' 2 (1.575 m)   Wt 203 lb (92.1 kg)   SpO2 97%   BMI 37.13 kg/m   Physical Exam Vitals and nursing note reviewed.  Constitutional:      General: He is not in acute distress.    Appearance: He is obese.  Cardiovascular:     Rate and Rhythm: Normal rate and regular rhythm.  Pulmonary:     Effort: Pulmonary effort is normal.     Breath sounds: Normal breath sounds.  Abdominal:     Palpations: Abdomen is soft.     Tenderness: There is no abdominal tenderness.  Neurological:     General: No focal deficit present.     Mental Status: He is alert and oriented to person, place, and time.         Assessment & Plan:   1. Essential hypertension (Primary) Elevated readings. Continue   2. Type 2 diabetes mellitus with other specified complication, with long-term current use of insulin  (HCC) Continue   3. Hyperlipidemia, unspecified hyperlipidemia type Continue    4. Seasonal allergies Continue   5. Special needs due to learning disability     Return in about 3 months (around 09/28/2024) for follow up, chronic med issues.   Tanda Raguel SQUIBB, MD

## 2024-06-29 ENCOUNTER — Encounter: Payer: Self-pay | Admitting: Family Medicine

## 2024-07-25 ENCOUNTER — Telehealth: Payer: Self-pay | Admitting: Family Medicine

## 2024-07-25 NOTE — Telephone Encounter (Signed)
 Patients mother came in requesting refills on amLODipine  (NORVASC ) 10 MG tablet

## 2024-07-26 ENCOUNTER — Other Ambulatory Visit: Payer: Self-pay | Admitting: Family Medicine

## 2024-07-26 DIAGNOSIS — I10 Essential (primary) hypertension: Secondary | ICD-10-CM

## 2024-07-26 MED ORDER — AMLODIPINE BESYLATE 10 MG PO TABS: 10.0000 mg | ORAL_TABLET | Freq: Every day | ORAL | 1 refills | Status: AC

## 2024-07-27 ENCOUNTER — Telehealth: Payer: Self-pay | Admitting: Family Medicine

## 2024-07-27 DIAGNOSIS — E785 Hyperlipidemia, unspecified: Secondary | ICD-10-CM

## 2024-07-27 NOTE — Telephone Encounter (Signed)
 Patient needs a refill for Pravastatin  sodium tablets 80mg 

## 2024-07-29 NOTE — Addendum Note (Signed)
 Addended by: Lakeyn Dokken E on: 07/29/2024 02:39 PM   Modules accepted: Orders

## 2024-08-02 MED ORDER — PRAVASTATIN SODIUM 80 MG PO TABS: ORAL_TABLET | ORAL | 1 refills | Status: AC

## 2024-08-04 NOTE — Progress Notes (Signed)
 Dalton Williams                                          MRN: [id number]   08/04/2024   The [facility] Quality Team Specialist reviewed this patient medical record for the purposes of chart review for care gap closure. The following were reviewed: chart review for care gap closure-diabetic eye exam.    [facility] Quality Team

## 2024-08-09 ENCOUNTER — Telehealth: Payer: Self-pay | Admitting: Family Medicine

## 2024-08-09 NOTE — Telephone Encounter (Signed)
 Pt mom requesting refill for furosemide 

## 2024-08-10 ENCOUNTER — Other Ambulatory Visit: Payer: Self-pay

## 2024-08-10 DIAGNOSIS — I10 Essential (primary) hypertension: Secondary | ICD-10-CM

## 2024-08-10 MED ORDER — FUROSEMIDE 20 MG PO TABS: ORAL_TABLET | ORAL | 0 refills | Status: AC

## 2024-08-10 NOTE — Telephone Encounter (Signed)
 Refilled

## 2024-08-27 ENCOUNTER — Emergency Department (HOSPITAL_COMMUNITY)

## 2024-08-27 ENCOUNTER — Emergency Department (HOSPITAL_COMMUNITY)
Admission: EM | Admit: 2024-08-27 | Discharge: 2024-08-27 | Disposition: A | Discharge status: Home / Self Care | Attending: Emergency Medicine | Admitting: Emergency Medicine

## 2024-08-27 DIAGNOSIS — Z7982 Long term (current) use of aspirin: Secondary | ICD-10-CM | POA: Diagnosis not present

## 2024-08-27 DIAGNOSIS — R519 Headache, unspecified: Secondary | ICD-10-CM | POA: Insufficient documentation

## 2024-08-27 DIAGNOSIS — Z7984 Long term (current) use of oral hypoglycemic drugs: Secondary | ICD-10-CM | POA: Insufficient documentation

## 2024-08-27 DIAGNOSIS — R109 Unspecified abdominal pain: Secondary | ICD-10-CM | POA: Insufficient documentation

## 2024-08-27 DIAGNOSIS — E119 Type 2 diabetes mellitus without complications: Secondary | ICD-10-CM | POA: Diagnosis not present

## 2024-08-27 DIAGNOSIS — I10 Essential (primary) hypertension: Secondary | ICD-10-CM | POA: Diagnosis not present

## 2024-08-27 DIAGNOSIS — R079 Chest pain, unspecified: Secondary | ICD-10-CM | POA: Diagnosis present

## 2024-08-27 LAB — CBC
HCT: 45.8 % (ref 39.0–52.0)
Hemoglobin: 14 g/dL (ref 13.0–17.0)
MCH: 22.2 pg — ABNORMAL LOW (ref 26.0–34.0)
MCHC: 30.6 g/dL (ref 30.0–36.0)
MCV: 72.5 fL — ABNORMAL LOW (ref 80.0–100.0)
Platelets: 248 K/uL (ref 150–400)
RBC: 6.32 MIL/uL — ABNORMAL HIGH (ref 4.22–5.81)
RDW: 15.5 % (ref 11.5–15.5)
WBC: 5.4 K/uL (ref 4.0–10.5)
nRBC: 0 % (ref 0.0–0.2)

## 2024-08-27 LAB — URINALYSIS, ROUTINE W REFLEX MICROSCOPIC
Bacteria, UA: NONE SEEN
Bilirubin Urine: NEGATIVE
Glucose, UA: NEGATIVE mg/dL
Hgb urine dipstick: NEGATIVE
Ketones, ur: NEGATIVE mg/dL
Leukocytes,Ua: NEGATIVE
Nitrite: NEGATIVE
Protein, ur: 30 mg/dL — AB
Specific Gravity, Urine: 1.021 (ref 1.005–1.030)
pH: 5 (ref 5.0–8.0)

## 2024-08-27 LAB — HEPATIC FUNCTION PANEL
ALT: 17 U/L (ref 0–44)
AST: 30 U/L (ref 15–41)
Albumin: 4.1 g/dL (ref 3.5–5.0)
Alkaline Phosphatase: 121 U/L (ref 38–126)
Bilirubin, Direct: 0.1 mg/dL (ref 0.0–0.2)
Indirect Bilirubin: 0.4 mg/dL (ref 0.3–0.9)
Total Bilirubin: 0.5 mg/dL (ref 0.0–1.2)
Total Protein: 7.2 g/dL (ref 6.5–8.1)

## 2024-08-27 LAB — BASIC METABOLIC PANEL WITH GFR
Anion gap: 11 (ref 5–15)
BUN: 23 mg/dL (ref 8–23)
CO2: 24 mmol/L (ref 22–32)
Calcium: 9.7 mg/dL (ref 8.9–10.3)
Chloride: 100 mmol/L (ref 98–111)
Creatinine, Ser: 1.61 mg/dL — ABNORMAL HIGH (ref 0.61–1.24)
GFR, Estimated: 47 mL/min — ABNORMAL LOW
Glucose, Bld: 174 mg/dL — ABNORMAL HIGH (ref 70–99)
Potassium: 5.1 mmol/L (ref 3.5–5.1)
Sodium: 135 mmol/L (ref 135–145)

## 2024-08-27 LAB — LIPASE, BLOOD: Lipase: 25 U/L (ref 11–51)

## 2024-08-27 LAB — TROPONIN T, HIGH SENSITIVITY
Troponin T High Sensitivity: 30 ng/L — ABNORMAL HIGH (ref 0–19)
Troponin T High Sensitivity: 33 ng/L — ABNORMAL HIGH (ref 0–19)

## 2024-08-27 MED ORDER — METOCLOPRAMIDE HCL 5 MG/ML IJ SOLN
10.0000 mg | Freq: Once | INTRAMUSCULAR | Status: AC
  Administered 2024-08-27: 10 mg via INTRAVENOUS
  Filled 2024-08-27: qty 2

## 2024-08-27 MED ORDER — LIDOCAINE VISCOUS HCL 2 % MT SOLN
15.0000 mL | Freq: Once | OROMUCOSAL | Status: AC
  Administered 2024-08-27: 15 mL via ORAL
  Filled 2024-08-27: qty 15

## 2024-08-27 MED ORDER — PANTOPRAZOLE SODIUM 20 MG PO TBEC: 20.0000 mg | DELAYED_RELEASE_TABLET | Freq: Every day | ORAL | 2 refills | Status: AC

## 2024-08-27 MED ORDER — ALUM & MAG HYDROXIDE-SIMETH 200-200-20 MG/5ML PO SUSP
30.0000 mL | Freq: Once | ORAL | Status: AC
  Administered 2024-08-27: 30 mL via ORAL
  Filled 2024-08-27: qty 30

## 2024-08-27 MED ORDER — DIPHENHYDRAMINE HCL 50 MG/ML IJ SOLN
12.5000 mg | Freq: Once | INTRAMUSCULAR | Status: AC
  Administered 2024-08-27: 12.5 mg via INTRAVENOUS
  Filled 2024-08-27: qty 1

## 2024-08-27 MED ORDER — LACTATED RINGERS IV BOLUS
500.0000 mL | Freq: Once | INTRAVENOUS | Status: AC
  Administered 2024-08-27: 500 mL via INTRAVENOUS

## 2024-08-27 MED ORDER — FAMOTIDINE IN NACL 20-0.9 MG/50ML-% IV SOLN
20.0000 mg | Freq: Once | INTRAVENOUS | Status: AC
  Administered 2024-08-27: 20 mg via INTRAVENOUS
  Filled 2024-08-27: qty 50

## 2024-08-27 MED ORDER — HYDROMORPHONE HCL 1 MG/ML IJ SOLN
0.5000 mg | Freq: Once | INTRAMUSCULAR | Status: AC
  Administered 2024-08-27: 0.5 mg via INTRAVENOUS
  Filled 2024-08-27: qty 1

## 2024-08-27 MED ORDER — IOHEXOL 350 MG/ML SOLN
100.0000 mL | Freq: Once | INTRAVENOUS | Status: AC | PRN
  Administered 2024-08-27: 100 mL via INTRAVENOUS

## 2024-08-27 NOTE — ED Provider Notes (Signed)
" ° ° °  ED Course / MDM   Clinical Course as of 08/27/24 2200  Sat Aug 27, 2024  2111 Received sign out from Dr. Melvenia pending repeat troponin [WS]  2159 Repeat troponin is stable.  Patient has otherwise had extensive workup which was all negative.  Discussed with patient and family.  They feel comfortable with him going home. Will discharge patient to home. All questions answered. Patient comfortable with plan of discharge. Return precautions discussed with patient and specified on the after visit summary.  [WS]    Clinical Course User Index [WS] Francesca Elsie CROME, MD   Medical Decision Making Amount and/or Complexity of Data Reviewed Labs: ordered. Radiology: ordered.  Risk OTC drugs. Prescription drug management.        Francesca Elsie CROME, MD 08/27/24 2200  "

## 2024-08-27 NOTE — ED Provider Triage Note (Addendum)
 Emergency Medicine Provider Triage Evaluation Note  Danil Wedge , a 69 y.o. male  was evaluated in triage.  Pt complains of multiple complaints.  Review of Systems  Positive: Chest pain, abdominal pain, headache, nausea, cough, shortness of breath Negative: Weakness, numbness  Physical Exam  BP (!) 145/79 (BP Location: Right Arm)   Pulse (!) 54   Temp 98 F (36.7 C) (Oral)   Resp 18   SpO2 97%  Gen:   Awake, no distress   Resp:  Normal effort  MSK:   Moves extremities without difficulty  Other:  Abdominal tenderness present  Medical Decision Making  Medically screening exam initiated at 5:07 PM.  Appropriate orders placed.  Elishah Winkles was informed that the remainder of the evaluation will be completed by another provider, this initial triage assessment does not replace that evaluation, and the importance of remaining in the ED until their evaluation is complete.  Patient presenting for multiple complaints.  He states that he had a recent cough but was otherwise in his normal state of health prior to going to church.  At church, he had onset of shortness of breath, chest pain, abdominal pain, nausea, and dizziness.  He was given ASA, NTG, and Zofran  prior to arrival.   Melvenia Motto, MD 08/27/24 1708    Melvenia Motto, MD 08/27/24 2023

## 2024-08-27 NOTE — Discharge Instructions (Signed)
 Your test results today are reassuring.  A prescription for medication called pantoprazole  was sent to your pharmacy.  Take this daily to lower the acid content in your stomach and minimize symptoms of reflux.  If you do have similar episodes in the future, you can take an over-the-counter Maalox or Mylanta as needed.  Return to the emergency department for any new or worsening symptoms of concern.

## 2024-08-27 NOTE — ED Triage Notes (Signed)
 Bib ems from church mid sternal chest pain and epigastric pain, SOB, nausea and cough. Cough started yesterday all other symptoms started thi afternoon.   EMD Vitals 124/76 64 hf Cbg 200 97% o2 18 LAC 2 nitroglycerin  324 asa  4mg  zofran   Pain improved from 10/10 to 1/10 with medication

## 2024-08-27 NOTE — ED Provider Notes (Signed)
 " [facility] Provider Note   CSN: [id number] Arrival date & time: 08/27/24  1636     Patient presents with: Chest Pain   Dalton Williams is a 69 y.o. male.    Chest Pain Associated symptoms: abdominal pain, cough, dizziness, headache, nausea and shortness of breath   Patient presents for multiple complaints.  Medical history includes HTN, HLD, DM.  He reports that he has had a recent cough but was otherwise in his normal state of health this morning.  When he went to church, he had onset of chest pain, abdominal pain, nausea, dizziness, shortness of breath, headache.  He arrives via EMS.  He did receive ASA, NTG, and Zofran  prior to arrival.  On arrival, patient endorsing ongoing abdominal discomfort, headache, chest pain, and shortness of breath.      Prior to Admission medications  Medication Sig Start Date End Date Taking? Authorizing Provider  pantoprazole  (PROTONIX ) 20 MG tablet Take 1 tablet (20 mg total) by mouth daily. 08/27/24  Yes Melvenia Motto, MD  amLODipine  (NORVASC ) 10 MG tablet TAKE 1 TABLET(10 MG) BY MOUTH DAILY 09/04/23   Tanda Bleacher, MD  amLODipine  (NORVASC ) 10 MG tablet Take 1 tablet (10 mg total) by mouth daily. 07/26/24   Tanda Bleacher, MD  aspirin 81 MG chewable tablet Chew 81 mg by mouth daily.    [provider]  carvedilol  (COREG ) 12.5 MG tablet Take 1 tablet (12.5 mg total) by mouth 2 (two) times daily with a meal. 06/03/24   Tanda Bleacher, MD  cetirizine  (ZYRTEC ) 10 MG tablet Take 1 tablet (10 mg total) by mouth daily. 03/28/24   Tanda Bleacher, MD  ferrous sulfate  324 (65 Fe) MG TBEC Take 1 tablet (325 mg total) by mouth every other day. 06/15/20   Prentiss Dorothyann Maxwell, MD  furosemide  (LASIX ) 20 MG tablet TAKE 1 TABLET(20 MG) BY MOUTH DAILY 08/10/24   Tanda Bleacher, MD  hydrochlorothiazide (HYDRODIURIL) 25 MG tablet Take by mouth. 02/24/17   [provider]  insulin  NPH Human (NOVOLIN N) 100  UNIT/ML injection Inject 0.35 mLs (35 Units total) into the skin 2 (two) times daily before a meal. 06/30/22   Delbert Clam, MD  Insulin  Syringe-Needle U-100 (BD INSULIN  SYRINGE U/F) 31G X 5/16 0.5 ML MISC USE AS DIRECTED FOR INSULIN  INJECTIONS TWICE DAILY 04/26/21   Jaycee Greig PARAS, NP  irbesartan  (AVAPRO ) 300 MG tablet TAKE 1 TABLET(300 MG) BY MOUTH DAILY 07/25/21   Tanda Bleacher, MD  metFORMIN  (GLUCOPHAGE ) 500 MG tablet Take 1 tablet (500 mg total) by mouth 2 (two) times daily with a meal. 04/14/24   Tanda Bleacher, MD  Multiple Vitamin (MULTIVITAMIN) tablet Take 1 tablet by mouth daily.    [provider]  oxybutynin  (DITROPAN ) 5 MG tablet Take 1 tablet (5 mg total) by mouth 2 (two) times daily. Patient not taking: Reported on 06/28/2024 01/23/21   Prentiss Dorothyann Maxwell, MD  pravastatin  (PRAVACHOL ) 80 MG tablet TAKE 1 TABLET(80 MG) BY MOUTH DAILY 09/04/23   Tanda Bleacher, MD  pravastatin  (PRAVACHOL ) 80 MG tablet TAKE 1 TABLET(80 MG) BY MOUTH DAILY 08/02/24   Tanda Bleacher, MD  spironolactone  (ALDACTONE ) 50 MG tablet TAKE 1 TABLET(50 MG) BY MOUTH DAILY 09/04/23   Tanda Bleacher, MD  spironolactone  (ALDACTONE ) 50 MG tablet TAKE 1 TABLET(50 MG) BY MOUTH DAILY 09/04/23   Tanda Bleacher, MD    Allergies: Penicillins    Review of Systems  Respiratory:  Positive for cough and shortness of  breath.   Cardiovascular:  Positive for chest pain.  Gastrointestinal:  Positive for abdominal pain and nausea.  Neurological:  Positive for dizziness and headaches.  All other systems reviewed and are negative.   Updated Vital Signs BP 129/80   Pulse (!) 55   Temp 98.2 F (36.8 C) (Oral)   Resp 19   SpO2 97%   Physical Exam Vitals and nursing note reviewed.  Constitutional:      General: He is not in acute distress.    Appearance: He is well-developed. He is not ill-appearing, toxic-appearing or diaphoretic.  HENT:     Head: Normocephalic and atraumatic.  Eyes:     Conjunctiva/sclera:  Conjunctivae normal.  Cardiovascular:     Rate and Rhythm: Normal rate and regular rhythm.     Heart sounds: No murmur heard. Pulmonary:     Effort: Pulmonary effort is normal. No respiratory distress.     Breath sounds: Normal breath sounds. No wheezing or rhonchi.  Chest:     Chest wall: No tenderness.  Abdominal:     Palpations: Abdomen is soft.     Tenderness: There is abdominal tenderness.  Musculoskeletal:        General: No swelling. Normal range of motion.     Cervical back: Normal range of motion and neck supple.  Skin:    General: Skin is warm and dry.     Coloration: Skin is not cyanotic or pale.  Neurological:     General: No focal deficit present.     Mental Status: He is alert and oriented to person, place, and time.  Psychiatric:        Mood and Affect: Mood normal.        Behavior: Behavior normal.     (all labs ordered are listed, but only abnormal results are displayed) Labs Reviewed  CBC - Abnormal; Notable for the following components:      Result Value   RBC 6.32 (*)    MCV 72.5 (*)    MCH 22.2 (*)    All other components within normal limits  BASIC METABOLIC PANEL WITH GFR - Abnormal; Notable for the following components:   Glucose, Bld 174 (*)    Creatinine, Ser 1.61 (*)    GFR, Estimated 47 (*)    All other components within normal limits  URINALYSIS, ROUTINE W REFLEX MICROSCOPIC - Abnormal; Notable for the following components:   APPearance HAZY (*)    Protein, ur 30 (*)    All other components within normal limits  TROPONIN T, HIGH SENSITIVITY - Abnormal; Notable for the following components:   Troponin T High Sensitivity 30 (*)    All other components within normal limits  HEPATIC FUNCTION PANEL  LIPASE, BLOOD    EKG: EKG Interpretation Date/Time:  Saturday August 27 2024 19:21:27 EST Ventricular Rate:  62 PR Interval:  226 QRS Duration:  88 QT Interval:  377 QTC Calculation: 383 R Axis:   -37  Text Interpretation: Sinus rhythm  Abnormal R-wave progression, early transition Left ventricular hypertrophy Anterior Q waves, possibly due to LVH Abnormal T, consider ischemia, diffuse leads Baseline wander in lead(s) V2 Confirmed by Melvenia Motto ([id number]) on 08/27/2024 7:42:52 PM  Radiology: CT Angio Chest/Abd/Pel for Dissection W and/or Wo Contrast Result Date: 08/27/2024 EXAM: CTA CHEST, ABDOMEN AND PELVIS WITHOUT AND WITH CONTRAST 08/27/2024 07:41:23 PM TECHNIQUE: CTA of the chest was performed without and with the administration of 100 mL of iohexol  (OMNIPAQUE ) 350 MG/ML injection. CTA of the  abdomen and pelvis was performed without and with the administration of 100 mL of iohexol  (OMNIPAQUE ) 350 MG/ML injection. Multiplanar reformatted images are provided for review. MIP images are provided for review. Automated exposure control, iterative reconstruction, and/or weight based adjustment of the mA/kV was utilized to reduce the radiation dose to as low as reasonably achievable. COMPARISON: 07/06/2016. CLINICAL HISTORY: Acute aortic syndrome (AAS) suspected. FINDINGS: VASCULATURE: AORTA: Aortic atherosclerosis. No acute finding. No abdominal aortic aneurysm. No dissection. PULMONARY ARTERIES: No evidence of pulmonary embolus. GREAT VESSELS OF AORTIC ARCH: No acute finding. No dissection. No arterial occlusion or significant stenosis. CELIAC TRUNK: No acute finding. No occlusion or significant stenosis. SUPERIOR MESENTERIC ARTERY: No acute finding. No occlusion or significant stenosis. INFERIOR MESENTERIC ARTERY: No acute finding. No occlusion or significant stenosis. RENAL ARTERIES: No acute finding. No occlusion or significant stenosis. ILIAC ARTERIES: No acute finding. No occlusion or significant stenosis. CHEST: MEDIASTINUM: No mediastinal lymphadenopathy. The heart and pericardium demonstrate no acute abnormality. LUNGS AND PLEURA: The lungs are without acute process. No focal consolidation or pulmonary edema. No evidence of pleural effusion or  pneumothorax. THORACIC BONES AND SOFT TISSUES: No acute bone or soft tissue abnormality. ABDOMEN AND PELVIS: LIVER: The liver is unremarkable. GALLBLADDER AND BILE DUCTS: Layering gallstones within the gallbladder. No biliary ductal dilatation. SPLEEN: The spleen is unremarkable. PANCREAS: The pancreas is unremarkable. ADRENAL GLANDS: Bilateral adrenal glands demonstrate no acute abnormality. KIDNEYS, URETERS AND BLADDER: No stones in the kidneys or ureters. No hydronephrosis. No perinephric or periureteral stranding. Urinary bladder is unremarkable. GI AND BOWEL: Stomach and duodenal sweep demonstrate no acute abnormality. Normal appendix. There is no bowel obstruction. No abnormal bowel wall thickening or distension. REPRODUCTIVE: Reproductive organs are unremarkable. PERITONEUM AND RETROPERITONEUM: No ascites or free air. LYMPH NODES: No lymphadenopathy. ABDOMINAL BONES AND SOFT TISSUES: No acute abnormality of the bones. Bilateral gynecomastia. IMPRESSION: 1. No evidence of acute aortic syndrome. 2. No evidence of pulmonary embolus. 3. Cholelithiasis. Electronically signed by: Franky Crease MD 08/27/2024 07:55 PM EST RP Workstation: [id number]   DG Chest 2 View Result Date: 08/27/2024 EXAM: 2 VIEW(S) XRAY OF THE CHEST 08/27/2024 07:04:00 PM COMPARISON: 07/06/2016 CLINICAL HISTORY: chest pain FINDINGS: LUNGS AND PLEURA: No focal pulmonary opacity. No vascular congestion, edema, or consolidation. No pleural effusion. No pneumothorax. HEART AND MEDIASTINUM: Cardiac enlargement. Mediastinal contours appear intact. Calcification of the aorta. BONES AND SOFT TISSUES: Mild degenerative changes in the spine. IMPRESSION: 1. No acute cardiopulmonary abnormality. 2. Cardiac enlargement. Electronically signed by: Elsie Gravely MD 08/27/2024 07:26 PM EST RP Workstation: [id number]   CT Head Wo Contrast Result Date: 08/27/2024 CLINICAL DATA:  Initial evaluation for acute headache, new onset. EXAM: CT HEAD WITHOUT CONTRAST  TECHNIQUE: Contiguous axial images were obtained from the base of the skull through the vertex without intravenous contrast. RADIATION DOSE REDUCTION: This exam was performed according to the departmental dose-optimization program which includes automated exposure control, adjustment of the mA and/or kV according to patient size and/or use of iterative reconstruction technique. COMPARISON:  None Available. FINDINGS: Brain: Cerebral volume within normal limits. No acute intracranial hemorrhage. No acute large vessel territory infarct. No mass lesion or midline shift. No hydrocephalus or extra-axial fluid collection. Vascular: No abnormal hyperdense vessel. Skull: Scalp soft tissues within normal limits.  Calvarium intact. Sinuses/Orbits: Globes orbital soft tissues within normal limits. Chronic left greater than right maxillary sinusitis. Additional mild chronic mucoperiosteal thickening about the sphenoid ethmoidal sinuses as well. Mastoid air cells are clear. Other: None. IMPRESSION: 1. No  acute intracranial abnormality. 2. Chronic paranasal sinus disease, most pronounced at the left maxillary sinus. Electronically Signed   By: Morene Hoard M.D.   On: 08/27/2024 19:10     Procedures   Medications Ordered in the ED  metoCLOPramide  (REGLAN ) injection 10 mg (10 mg Intravenous Given 08/27/24 1720)  diphenhydrAMINE  (BENADRYL ) injection 12.5 mg (12.5 mg Intravenous Given 08/27/24 1721)  lactated ringers  bolus 500 mL (0 mLs Intravenous Stopped 08/27/24 1823)  HYDROmorphone  (DILAUDID ) injection 0.5 mg (0.5 mg Intravenous Given 08/27/24 2006)  iohexol  (OMNIPAQUE ) 350 MG/ML injection 100 mL (100 mLs Intravenous Contrast Given 08/27/24 1943)  alum & mag hydroxide-simeth (MAALOX/MYLANTA) 200-200-20 MG/5ML suspension 30 mL (30 mLs Oral Given 08/27/24 2007)    And  lidocaine  (XYLOCAINE ) 2 % viscous mouth solution 15 mL (15 mLs Oral Given 08/27/24 2007)  famotidine  (PEPCID ) IVPB 20 mg premix (20 mg Intravenous New  Bag/Given 08/27/24 2008)                                    Medical Decision Making Amount and/or Complexity of Data Reviewed Labs: ordered. Radiology: ordered.  Risk OTC drugs. Prescription drug management.   This patient presents to the ED for concern of multiple complaints, this involves an extensive number of treatment options, and is a complaint that carries with it a high risk of complications and morbidity.  The differential diagnosis includes ACS, dissection, PE, GERD, intra-abdominal infection, SBO, constipation   Co morbidities / Chronic conditions that complicate the patient evaluation  HTN, HLD, DM   Additional history obtained:  Additional history obtained from EMR External records from outside source obtained and reviewed including patient's mother   Lab Tests:  I Ordered, and personally interpreted labs.  The pertinent results include: Baseline creatinine, no leukocytosis, normal electrolytes, normal hepatobiliary enzymes, normal lipase, normal troponin   Imaging Studies ordered:  I ordered imaging studies including chest x-ray, CT head, CTA dissection study I independently visualized and interpreted imaging which showed no acute findings I agree with the radiologist interpretation   Cardiac Monitoring: / EKG:  The patient was maintained on a cardiac monitor.  I personally viewed and interpreted the cardiac monitored which showed an underlying rhythm of: Sinus rhythm   Problem List / ED Course / Critical interventions / Medication management  Patient presenting for multiple complaints.  While at church today, he had onset of chest pain, abdominal pain, nausea, headache, shortness of breath, dizziness.  On arrival, he is alert and oriented.  He does not have any focal neurologic deficits.  He does have abdominal tenderness.  His current breathing is unlabored and he is able to speak in complete sentences.  Lungs are clear to auscultation.  IV fluids,  Reglan , and Benadryl  ordered for headache and nausea.  Workup was initiated.  On reassessment, headache has resolved.  He has ongoing chest and abdominal pain.  Dose of Dilaudid  was ordered.  Patient's initial troponin was reassuring.  He underwent CTA dissection study which did not show any acute findings.  GI cocktail was ordered for possible GERD etiology.  On further reassessment, patient feeling much better after GI cocktail.  I suspect symptoms were secondary to reflux.  Currently, he is not on a PPI.  He is accompanied now at bedside by his mother and cousin.  They were present at church during onset of symptoms.  Patient was prescribed daily Protonix .  He was advised to  take Maalox or Mylanta as needed.  He is currently on a daily dose of MiraLAX  to maintain soft stools.  Repeat troponin was pending at time of signout.  Care of patient was signed out to oncoming ED provider. I ordered medication including IV fluids hydration, Reglan  and Benadryl  for headache, Dilaudid  for analgesia, GI cocktail for reflux Reevaluation of the patient after these medicines showed that the patient improved I have reviewed the patients home medicines and have made adjustments as needed  Social Determinants of Health:  Lives at home with family, intellectual disability at baseline     Final diagnoses:  Chest pain, unspecified type    ED Discharge Orders          Ordered    pantoprazole  (PROTONIX ) 20 MG tablet  Daily        08/27/24 2036               Melvenia Motto, MD 08/27/24 2051  "

## 2024-09-08 ENCOUNTER — Telehealth: Payer: Self-pay | Admitting: Family Medicine

## 2024-09-08 NOTE — Telephone Encounter (Signed)
 I called patient to reschedule his appt for 1/28 to another day. Provider will be in a meeting. Left voicemail.

## 2024-09-21 ENCOUNTER — Ambulatory Visit: Admitting: Family Medicine

## 2024-10-25 ENCOUNTER — Ambulatory Visit: Admitting: Family Medicine
